# Patient Record
Sex: Male | Born: 1945 | Race: White | Hispanic: No | Marital: Married | Smoking: Former smoker
Health system: Southern US, Community
[De-identification: ages and names within clinical notes are randomized; demographics above are authoritative.]

## PROBLEM LIST (undated history)

## (undated) DIAGNOSIS — C61 Malignant neoplasm of prostate: Secondary | ICD-10-CM

## (undated) DIAGNOSIS — F4310 Post-traumatic stress disorder, unspecified: Secondary | ICD-10-CM

## (undated) DIAGNOSIS — R011 Cardiac murmur, unspecified: Secondary | ICD-10-CM

## (undated) DIAGNOSIS — E785 Hyperlipidemia, unspecified: Secondary | ICD-10-CM

## (undated) HISTORY — DX: Malignant neoplasm of prostate: C61

## (undated) HISTORY — DX: Cardiac murmur, unspecified: R01.1

## (undated) HISTORY — PX: PROSTATECTOMY: SHX69

## (undated) HISTORY — PX: TONSILLECTOMY: SUR1361

## (undated) HISTORY — DX: Hyperlipidemia, unspecified: E78.5

## (undated) HISTORY — PX: KNEE ARTHROSCOPY: SHX127

## (undated) HISTORY — DX: Post-traumatic stress disorder, unspecified: F43.10

## (undated) HISTORY — PX: HAND SURGERY: SHX662

## (undated) HISTORY — PX: INGUINAL HERNIA REPAIR: SUR1180

## (undated) HISTORY — PX: NASAL SEPTUM SURGERY: SHX37

---

## 2004-08-14 ENCOUNTER — Ambulatory Visit: Payer: Self-pay | Admitting: Internal Medicine

## 2004-09-02 ENCOUNTER — Ambulatory Visit: Payer: Self-pay | Admitting: Internal Medicine

## 2005-02-23 ENCOUNTER — Ambulatory Visit: Payer: Self-pay

## 2005-05-09 ENCOUNTER — Encounter: Admission: RE | Admit: 2005-05-09 | Discharge: 2005-05-09 | Payer: Self-pay | Admitting: Family Medicine

## 2006-10-04 DIAGNOSIS — C61 Malignant neoplasm of prostate: Secondary | ICD-10-CM

## 2006-10-04 HISTORY — DX: Malignant neoplasm of prostate: C61

## 2007-04-18 ENCOUNTER — Ambulatory Visit (HOSPITAL_COMMUNITY): Admission: RE | Admit: 2007-04-18 | Discharge: 2007-04-18 | Payer: Self-pay | Admitting: Urology

## 2007-05-02 ENCOUNTER — Ambulatory Visit: Admission: RE | Admit: 2007-05-02 | Discharge: 2007-06-23 | Payer: Self-pay | Admitting: Radiation Oncology

## 2007-06-15 ENCOUNTER — Ambulatory Visit: Admission: RE | Admit: 2007-06-15 | Discharge: 2007-06-15 | Payer: Self-pay | Admitting: Urology

## 2007-06-27 ENCOUNTER — Encounter (INDEPENDENT_AMBULATORY_CARE_PROVIDER_SITE_OTHER): Payer: Self-pay | Admitting: Urology

## 2007-06-27 ENCOUNTER — Inpatient Hospital Stay (HOSPITAL_COMMUNITY): Admission: RE | Admit: 2007-06-27 | Discharge: 2007-06-28 | Payer: Self-pay | Admitting: Urology

## 2009-07-09 ENCOUNTER — Encounter (INDEPENDENT_AMBULATORY_CARE_PROVIDER_SITE_OTHER): Payer: Self-pay | Admitting: *Deleted

## 2009-08-05 ENCOUNTER — Ambulatory Visit: Payer: Self-pay | Admitting: Internal Medicine

## 2009-08-19 ENCOUNTER — Ambulatory Visit: Payer: Self-pay | Admitting: Internal Medicine

## 2010-02-14 ENCOUNTER — Emergency Department (HOSPITAL_BASED_OUTPATIENT_CLINIC_OR_DEPARTMENT_OTHER): Admission: EM | Admit: 2010-02-14 | Discharge: 2010-02-14 | Payer: Self-pay | Admitting: Emergency Medicine

## 2011-02-16 NOTE — H&P (Signed)
[name], [name]              ACCOUNT NO.:  [id number]   MEDICAL RECORD NO.:  [id number]          PATIENT TYPE:  INP   LOCATION:  1440                         FACILITY:  [facility]   PHYSICIAN:  Ronald L. Earlene Plater, M.D.  DATE OF BIRTH:  November 12, 1945   DATE OF ADMISSION:  06/27/2007  DATE OF DISCHARGE:                              HISTORY & PHYSICAL   CHIEF COMPLAINT:  Here for surgery.   HISTORY OF PRESENT ILLNESS:  [name] is a 65 year old male who was  found to have an elevated PSA by his primary care physician.  In  addition, he had a left-sided nodule.  He was biopsied and found to have  Gleason's 3+4 primarily on the left side, although he did have a small  focus of Gleason 3+4 and Gleason 3+3 on the right.  His metastatic  survey was negative.  He presents for surgical extirpation.  Again, he  is intermediate risk Gleason's 3+4, clinical stage T2B, pretreatment TSA  3.4.   PAST MEDICAL HISTORY:  Positive for:  1. A heart murmur.  2. Hypercholesterolemia.  3. Gastroesophageal reflux disease.   PAST SURGICAL HISTORY:  1. Tonsillectomy.  2. Remote hand and knee surgery.   MEDICATIONS:  1. Vytorin.  2. TriCor.  3. Multivitamin.  4. Aspirin.   ALLERGIES:  PENICILLIN.   FAMILY HISTORY:  Negative for GU malignancy or nephrolithiasis.   SOCIAL HISTORY:  The patient has approximately a 6-pack year history and  has been abstinent for 35 years.  He has a glass of beer daily.   REVIEW OF SYSTEMS:  Negative for urinary symptoms.  CONSTITUTIONAL:  Negative for fever, night sweats.  SKIN:  Negative for itching or  rashes.  EYES:  Negative for blurred or double vision.  ENT:  Negative  for sore throat or sinus problems.  HEMATOLOGIC:  Negative for easy  bruising or bleeding.  CARDIOVASCULAR:  Negative for chest pain or leg  swelling.  RESPIRATORY:  Negative for cough, shortness of breath.  ENDOCRINE:  Negative for diabetes mellitus or thyroid gland dysfunction.  MUSCULOSKELETAL:   No back or joint pain.  GASTROINTESTINAL:  No nausea,  vomiting or abdominal pain.  No melena or hematochezia.  NEUROLOGIC:  Negative for headache or dizziness.  PSYCHOLOGIC:  No depression or  anxiety.   PHYSICAL EXAM:  VITAL SIGNS:  Afebrile, stable vitals.  HEENT:  Head is normocephalic and atraumatic and extraocular movements  are intact.  NECK:  Supple with no lymphadenopathy.  CHEST:  Clear to auscultation.  HEART:  Regular in rate and rhythm.  ABDOMEN:  Soft, nontender, nondistended.  TESTES:  Descended bilaterally, normal in contour.  SKIN:  Without rashes or lesions.  EXTREMITIES:  Warm and well perfused.   IMPRESSION:  Intermediate risk Gleason's 3 plus 4, clinical stage T2B,  pretreatment PSA 3.4, adenocarcinoma of the prostate.   PLAN:  The patient will be taken to the operating room for robotic-  assisted laparoscopic prostatectomy with a bilateral pelvic  lymphadenectomy.  He has been informed of the risks and benefits of the  procedure and signed the consent form on  the chart.  He will be admitted  after surgery for a period of observation.     ______________________________  Celene Squibb. Earlene Plater, M.D.  Electronically Signed    JR/MEDQ  D:  06/27/2007  T:  06/27/2007  Job:  [id number]

## 2011-02-16 NOTE — Op Note (Signed)
[name], GUTHRIE              ACCOUNT NO.:  [id number]   MEDICAL RECORD NO.:  [id number]          PATIENT TYPE:  INP   LOCATION:  1440                         [facility]ACILITY:  [facility]   PHYSICIAN:  Ronald L. Earlene Plater, M.D.  DATE OF BIRTH:  04/27/1946   DATE OF PROCEDURE:  06/27/2007  DATE OF DISCHARGE:                               OPERATIVE REPORT   PREOPERATIVE DIAGNOSIS:  Clinically localized adenocarcinoma of the  prostate.   POSTOPERATIVE DIAGNOSIS:  Clinically localized adenocarcinoma of the  prostate.   PROCEDURE PERFORMED:  1. Robotic assisted laparoscopic prostatectomy.  2. Bilateral pelvic lymphadenectomy.   SURGEON:  Dr. Gaynelle Arabian.   RESIDENT:  Melina Schools.   ANESTHESIA:  General endotracheal.   DRAINS:  Coude catheter, Jackson-Pratt.   ESTIMATED BLOOD LOSS:  200 mL.   INDICATION FOR PROCEDURE:  Please see full dictated H&P.  Briefly, Mr.  [name] is a 65 year old male with clinically localized adenocarcinoma of  the prostate.  He has clinical T2B disease on DRE, and Gleason's 3 +4,  primarily on the left on prostate biopsy.  His pretreatment PSA is 3.4.   DESCRIPTION OF PROCEDURE IN DETAIL:  The patient was brought to the  operating room.  He is identified by his arm band.  Informed consent is  verified, and preoperative time-out is performed.  After the successful  induction of general endotracheal anesthesia, the patient was moved to  the low lithotomy position where the abdomen and genitalia were prepped  and draped in the usual fashion.  The surgeons regowned and regloved.  Foley catheter was inserted transurethrally into the bladder.  Bladder  was drained, and the Foley was plugged.  A supraumbilical incision was  made in the midline.  Blunt and sharp dissection was used to carry this  down to the abdominal wall fascia which was incised sharply under direct  vision.  The peritoneum was similarly incised under direct vision, and  the abdominal cavity was  entered.  We then placed a 12 mm camera port  under direct vision by cut-down.  We then insufflated the abdomen with  carbon dioxide.  Once the pneumoperitoneum was fully established, we  measured our remaining ports.  Lidocaine was injected subcutaneously.  We placed two flanking 8 mm robotic ports.  We placed a third robotic  port in the left lower quadrant.  A 12 mm assistant port was placed in  the right lower quadrant in the mid axillary line.  The suction port was  placed in the paramedian position.  We then performed robotic assisted  laparoscopy.  The median umbilical ligaments were identified and  divided.  We then dissected down toward the pelvic brim.  We noted very  narrow pelvis.  Once the bladder was dropped, we cleared the fat off the  anterior portion of the prostate.  The fourth arm was used to place the  bladder on traction.  We then identified the endopelvic fascia lateral  to the prostatic plane.  This was entered sharply.  The plane was  developed between the prostate and the levator ani.  Then these were  swept gently away with both blunt dissection and bipolar electrocautery.  Once the lateral plane was dissolved bilaterally, we noted the groove  for the dorsal vein.  A vascular GIA staple load was used to ligate  that.  Having incised the endopelvic fascia bilaterally and completed  the lateral dissection as well as the ligation of the vein, we proceeded  to bladder neck.  The Foley catheter was placed on gentle traction to  delineate the bladder neck.  This was then incised with Bovie cautery.  Once we entered the bladder, the Foley catheter was seen and placed on  gentle upward traction, placing the prostate in the hammock position.  We then completed the posterior portion of the bladder neck, and the  bladder was completely dropped off the specimen.  We then began the  dissection for the structures.  We remained in the midline and  identified bilateral vaso  differentia.  These were placed on upward  traction and gently dissected out.  Once an adequate length was  developed on the left vas, it was divided sharply and placed on upward  traction.  A similar dissection was done on the right vas.  We then  again dissected out the left and right seminal vesicles with a  combination of blunt and sharp dissection.  There appeared to be  somewhat fibrous rind surrounding the SVs bilaterally.  Once the  structures were completely divided, we proceeded with the posterior  dissection.  We attempted to find the plane 1 layer below Denonvilliers  fascia.  This entire area was very indurated, and all the planes had  been obliterated.  The decision was then made to improve exposure by  sacrificing the pedicles bilaterally.  This was done with a series of  Hem-o-lok clips.  We continued to have extremely poor visualization  posteriorly and were unable to develop the plane on the posterior aspect  of the prostate.  We then decided to forgo this at this time and proceed  with the urethral division.  The urethra was divided sharply with the  guide of a Foley catheter.  We then continued to circumferentially  develop the posterior plane until the entire specimen was removed.  The  prostate and seminal vesicles were then placed in the right upper  quadrant.  We then proceeded to pelvic lymphadenectomy.  This was done  bilaterally.  The limits of the dissection were the iliacus muscle  medially, the high epigastric, the obturator nerve posteriorly, the  iliac vessels laterally, and it was taken from the superior aspect of  the bifurcation down to Cloquet's node.  This was done bilaterally, and  these were sent separately for permanent pathology.  We then proceeded  with the vesicourethral anastomosis.  We first irrigated the pelvis and  insufflated the rectum with air and saw no leak.  A single Vicryl suture  was used to reapproximate the posterior aspect of the  urethra to the  posterior bladder neck.  The anastomosis was then closed with a running  double-armed Monocryl in usual fashion.  It was tested afterwards and  was noted to be watertight.  We placed J-P drain up the left most port.  We then undocked the robot.  We then placed the specimen in an EndoCatch  device.  We then extended the median camera portals at extraction point.  The prostate was removed en bloc.  Fascia was closed with 0 Vicryl.  The  skin was closed with staples.  The  drain was sutured in place with a  nylon.  At this time, the procedure was terminated.  The patient  tolerated the procedure well, and there were no complications.  Dr. Darvin Neighbours was the attending primary responsible physician, present and  participated in all aspects of the procedure.   DISPOSITION:  The patient was extubated uneventfully and transferred  safely to the postanesthesia care unit.      Terie Purser, MD      Lucrezia Starch. Earlene Plater, M.D.  Electronically Signed    JH/MEDQ  D:  06/27/2007  T:  06/27/2007  Job:  [id number]

## 2011-07-15 LAB — HEMOGLOBIN AND HEMATOCRIT, BLOOD: Hemoglobin: 12.6 — ABNORMAL LOW

## 2011-07-16 LAB — COMPREHENSIVE METABOLIC PANEL
ALT: 29
Alkaline Phosphatase: 39
BUN: 11
CO2: 26
Calcium: 9.3
GFR calc Af Amer: >60
GFR calc non Af Amer: >60
Glucose, Bld: 101 — ABNORMAL HIGH
Sodium: 139
Total Bilirubin: 1

## 2011-07-16 LAB — CBC
HCT: 39.9
Hemoglobin: 13.9
Platelets: 285
RBC: 4.44

## 2011-07-16 LAB — PROTIME-INR
INR: 0.9
Prothrombin Time: 12.8

## 2011-07-16 LAB — URINALYSIS, ROUTINE W REFLEX MICROSCOPIC
Ketones, ur: NEGATIVE
Nitrite: NEGATIVE
Protein, ur: NEGATIVE
pH: 6.5

## 2011-07-16 LAB — APTT: aPTT: 30

## 2015-01-30 ENCOUNTER — Encounter: Payer: Self-pay | Admitting: Internal Medicine

## 2015-01-30 ENCOUNTER — Encounter: Payer: Self-pay | Admitting: Gastroenterology

## 2015-10-05 HISTORY — PX: COLONOSCOPY: SHX174

## 2016-06-14 ENCOUNTER — Encounter: Payer: Self-pay | Admitting: Gastroenterology

## 2016-06-14 ENCOUNTER — Telehealth: Payer: Self-pay | Admitting: Internal Medicine

## 2016-06-14 NOTE — Telephone Encounter (Signed)
Nov 2017 was intended recall based upon hx of polyp it looks like  That looks ok to me  He could schedule it now if desired

## 2016-06-14 NOTE — Telephone Encounter (Signed)
Should this go to Doc of the day, I don't see where the pt has been seen by anyone since Dr Olevia Perches?

## 2016-06-14 NOTE — Telephone Encounter (Signed)
Sheri,  Dr. Carlean Purl is Doc of the Day. Please advise as to when next colon should be. Thanks.

## 2016-06-14 NOTE — Telephone Encounter (Signed)
Colonoscopy scheduled with Dr. Danis °

## 2016-06-14 NOTE — Telephone Encounter (Signed)
Please review and advise recall date.  Hx with [name]

## 2016-08-19 ENCOUNTER — Ambulatory Visit (AMBULATORY_SURGERY_CENTER): Payer: Self-pay

## 2016-08-19 VITALS — Ht 69.0 in | Wt 197.6 lb

## 2016-08-19 DIAGNOSIS — Z800 Family history of malignant neoplasm of digestive organs: Secondary | ICD-10-CM

## 2016-08-19 MED ORDER — SUPREP BOWEL PREP KIT 17.5-3.13-1.6 GM/177ML PO SOLN
1.0000 | Freq: Once | ORAL | 0 refills | Status: AC
Start: 2016-08-19 — End: 2016-08-19

## 2016-08-19 NOTE — Progress Notes (Signed)
No allergies to eggs or soy No diet meds No home oxygen No past problems with anesthesia  Declined emmi 

## 2016-09-02 ENCOUNTER — Encounter: Payer: Self-pay | Admitting: Gastroenterology

## 2016-09-02 ENCOUNTER — Ambulatory Visit (AMBULATORY_SURGERY_CENTER): Payer: Medicare Other | Admitting: Gastroenterology

## 2016-09-02 VITALS — BP 125/73 | HR 53 | Temp 97.3°F | Resp 15 | Ht 69.0 in | Wt 197.0 lb

## 2016-09-02 DIAGNOSIS — Z86010 Personal history of colonic polyps: Secondary | ICD-10-CM | POA: Diagnosis present

## 2016-09-02 DIAGNOSIS — D123 Benign neoplasm of transverse colon: Secondary | ICD-10-CM | POA: Diagnosis not present

## 2016-09-02 MED ORDER — SODIUM CHLORIDE 0.9 % IV SOLN: 500.0000 mL | INTRAVENOUS | Status: DC

## 2016-09-02 NOTE — Op Note (Signed)
[name]: [name] Procedure Date: 09/02/2016 10:02 AM MRN: [id number] Endoscopist: Malo. Loletha Carrow , MD Age: 70 Referring MD:  Date of Birth: Feb 11, 1946 Gender: Male Account #: [id number] Procedure:                Colonoscopy Indications:              High risk colon cancer surveillance: Personal                            history of colonic polyps ( polyp not retrieved in                            2005, no polyps on colonoscopy 2010) Medicines:                Monitored Anesthesia Care Procedure:                Pre-Anesthesia Assessment:                           - Prior to the procedure, a History and Physical                            was performed, and patient medications and                            allergies were reviewed. The patient's tolerance of                            previous anesthesia was also reviewed. The risks                            and benefits of the procedure and the sedation                            options and risks were discussed with the patient.                            All questions were answered, and informed consent                            was obtained. Anticoagulants: The patient has taken                            aspirin. It was decided not to withhold this                            medication prior to the procedure. ASA Grade                            Assessment: II - A patient with mild systemic                            disease. After reviewing the risks and benefits,  the patient was deemed in satisfactory condition to                            undergo the procedure.                           After obtaining informed consent, the colonoscope                            was passed under direct vision. Throughout the                            procedure, the patient's blood pressure, pulse, and                            oxygen saturations were monitored continuously. The                             Model CF-HQ190L [id number]) scope was introduced                            through the anus and advanced to the the cecum,                            identified by appendiceal orifice and ileocecal                            valve. The colonoscopy was performed without                            difficulty. The patient tolerated the procedure                            well. The quality of the bowel preparation was                            excellent. The ileocecal valve, appendiceal                            orifice, and rectum were photographed. The bowel                            preparation used was SUPREP. Scope In: 10:15:11 AM Scope Out: 10:31:09 AM Scope Withdrawal Time: 0 hours 11 minutes 34 seconds  Total Procedure Duration: 0 hours 15 minutes 58 seconds  Findings:                 The perianal and digital rectal examinations were                            normal.                           A 4 mm polyp was found in the proximal transverse  colon. The polyp was sessile. The polyp was removed                            with a cold snare. Resection and retrieval were                            complete.                           The exam was otherwise without abnormality on                            direct and retroflexion views. Complications:            No immediate complications. Estimated Blood Loss:     Estimated blood loss: none. Impression:               - One 4 mm polyp in the proximal transverse colon,                            removed with a cold snare. Resected and retrieved.                           - The examination was otherwise normal on direct                            and retroflexion views. Recommendation:           - Patient has a contact number available for                            emergencies. The signs and symptoms of potential                            delayed complications were discussed with  the                            patient. Return to normal activities tomorrow.                            Written discharge instructions were provided to the                            patient.                           - Resume previous diet.                           - Continue present medications.                           - Await pathology results.                           - Repeat colonoscopy is recommended for  surveillance. The colonoscopy date will be                            determined after pathology results from today's                            exam become available for review. Henry L. Loletha Carrow, MD 09/02/2016 10:35:04 AM This report has been signed electronically.

## 2016-09-02 NOTE — Progress Notes (Signed)
Called to room to assist during endoscopic procedure.  Patient ID and intended procedure confirmed with present staff. Received instructions for my participation in the procedure from the performing physician.  

## 2016-09-02 NOTE — Patient Instructions (Signed)
YOU HAD AN ENDOSCOPIC PROCEDURE TODAY AT THE [facility]:   Refer to the procedure report that was given to you for any specific questions about what was found during the examination.  If the procedure report does not answer your questions, please call your gastroenterologist to clarify.  If you requested that your care partner not be given the details of your procedure findings, then the procedure report has been included in a sealed envelope for you to review at your convenience later.  YOU SHOULD EXPECT: Some feelings of bloating in the abdomen. Passage of more gas than usual.  Walking can help get rid of the air that was put into your GI tract during the procedure and reduce the bloating. If you had a lower endoscopy (such as a colonoscopy or flexible sigmoidoscopy) you may notice spotting of blood in your stool or on the toilet paper. If you underwent a bowel prep for your procedure, you may not have a normal bowel movement for a few days.  Please Note:  You might notice some irritation and congestion in your nose or some drainage.  This is from the oxygen used during your procedure.  There is no need for concern and it should clear up in a day or so.  SYMPTOMS TO REPORT IMMEDIATELY:   Following lower endoscopy (colonoscopy or flexible sigmoidoscopy):  Excessive amounts of blood in the stool  Significant tenderness or worsening of abdominal pains  Swelling of the abdomen that is new, acute  Fever of 100F or higher   For urgent or emergent issues, a gastroenterologist can be reached at any hour by calling [phone].   DIET:  We do recommend a small meal at first, but then you may proceed to your regular diet.  Drink plenty of fluids but you should avoid alcoholic beverages for 24 hours.  ACTIVITY:  You should plan to take it easy for the rest of today and you should NOT DRIVE or use heavy machinery until tomorrow (because of the sedation medicines used during the test).     FOLLOW UP: Our staff will call the number listed on your records the next business day following your procedure to check on you and address any questions or concerns that you may have regarding the information given to you following your procedure. If we do not reach you, we will leave a message.  However, if you are feeling well and you are not experiencing any problems, there is no need to return our call.  We will assume that you have returned to your regular daily activities without incident.  If any biopsies were taken you will be contacted by phone or by letter within the next 1-3 weeks.  Please call us at [phone] if you have not heard about the biopsies in 3 weeks.    SIGNATURES/CONFIDENTIALITY: You and/or your care partner have signed paperwork which will be entered into your electronic medical record.  These signatures attest to the fact that that the information above on your After Visit Summary has been reviewed and is understood.  Full responsibility of the confidentiality of this discharge information lies with you and/or your care-partner.  Read all handouts given to you by your recovery room nurse.   Thank-you for choosing us for your healthcare needs today. 

## 2016-09-02 NOTE — Progress Notes (Signed)
Report to PACU, RN, vss, BBS= Clear.  

## 2016-09-03 ENCOUNTER — Telehealth: Payer: Self-pay

## 2016-09-03 NOTE — Telephone Encounter (Signed)
  Follow up Call-  Call back number 09/02/2016  Post procedure Call Back phone  # [phone]  Permission to leave phone message Yes  Some recent data might be hidden     Patient questions:  Do you have a fever, pain , or abdominal swelling? No. Pain Score  0 *  Have you tolerated food without any problems? Yes.    Have you been able to return to your normal activities? Yes.    Do you have any questions about your discharge instructions: Diet   No. Medications  No. Follow up visit  No.  Do you have questions or concerns about your Care? No.  Actions: * If pain score is 4 or above: No action needed, pain <4.

## 2016-09-10 ENCOUNTER — Encounter: Payer: Self-pay | Admitting: Gastroenterology

## 2019-10-30 ENCOUNTER — Ambulatory Visit: Payer: Medicare Other

## 2019-11-08 ENCOUNTER — Ambulatory Visit: Payer: Medicare Other | Attending: Internal Medicine

## 2019-11-08 DIAGNOSIS — Z23 Encounter for immunization: Secondary | ICD-10-CM

## 2019-11-08 NOTE — Progress Notes (Signed)
   Covid-19 Vaccination Clinic  Name:  [name]    MRN: [id number] DOB: [birth year]  11/08/2019  [name] was observed post Covid-19 immunization for 15 minutes without incidence. He was provided with Vaccine Information Sheet and instruction to access the V-Safe system.   [name] was instructed to call 911 with any severe reactions post vaccine: Marland Kitchen Difficulty breathing  . Swelling of your face and throat  . A fast heartbeat  . A bad rash all over your body  . Dizziness and weakness    Immunizations Administered    Name Date Dose VIS Date Route   Pfizer COVID-19 Vaccine 11/08/2019  3:50 PM 0.3 mL 09/14/2019 Intramuscular   Manufacturer: [place]   Lot: [id number]   [place]: [id number]

## 2019-11-10 ENCOUNTER — Ambulatory Visit: Payer: Medicare Other

## 2019-11-14 ENCOUNTER — Other Ambulatory Visit: Payer: Self-pay | Admitting: Neurosurgery

## 2019-11-14 ENCOUNTER — Other Ambulatory Visit (HOSPITAL_COMMUNITY): Payer: Self-pay | Admitting: Neurosurgery

## 2019-11-14 DIAGNOSIS — G959 Disease of spinal cord, unspecified: Secondary | ICD-10-CM

## 2019-11-14 DIAGNOSIS — M5440 Lumbago with sciatica, unspecified side: Secondary | ICD-10-CM

## 2019-11-27 ENCOUNTER — Other Ambulatory Visit: Payer: Self-pay

## 2019-11-27 ENCOUNTER — Ambulatory Visit (HOSPITAL_COMMUNITY)
Admission: RE | Admit: 2019-11-27 | Discharge: 2019-11-27 | Disposition: A | Discharge status: Home / Self Care | Payer: Medicare Other | Source: Ambulatory Visit | Attending: Neurosurgery | Admitting: Neurosurgery

## 2019-11-27 DIAGNOSIS — G959 Disease of spinal cord, unspecified: Secondary | ICD-10-CM | POA: Diagnosis present

## 2019-11-27 DIAGNOSIS — M5440 Lumbago with sciatica, unspecified side: Secondary | ICD-10-CM | POA: Diagnosis present

## 2019-11-30 ENCOUNTER — Ambulatory Visit: Payer: Medicare Other

## 2019-12-03 ENCOUNTER — Ambulatory Visit: Payer: Medicare Other | Attending: Internal Medicine

## 2019-12-03 DIAGNOSIS — Z23 Encounter for immunization: Secondary | ICD-10-CM

## 2019-12-03 NOTE — Progress Notes (Signed)
   Covid-19 Vaccination Clinic  Name:  [name]    MRN: [id number] DOB: [birth year]  12/03/2019  [name] was observed post Covid-19 immunization for 15 minutes without incidence. He was provided with Vaccine Information Sheet and instruction to access the V-Safe system.   [name] was instructed to call 911 with any severe reactions post vaccine: Marland Kitchen Difficulty breathing  . Swelling of your face and throat  . A fast heartbeat  . A bad rash all over your body  . Dizziness and weakness    Immunizations Administered    Name Date Dose VIS Date Route   Pfizer COVID-19 Vaccine 12/03/2019  5:32 PM 0.3 mL 09/14/2019 Intramuscular   Manufacturer: [place]   Lot: [id number]   [place]: [id number]

## 2020-01-08 ENCOUNTER — Encounter: Payer: Self-pay | Admitting: Physical Therapy

## 2020-01-08 ENCOUNTER — Ambulatory Visit: Payer: No Typology Code available for payment source | Attending: Neurosurgery | Admitting: Physical Therapy

## 2020-01-08 ENCOUNTER — Other Ambulatory Visit: Payer: Self-pay

## 2020-01-08 DIAGNOSIS — M6281 Muscle weakness (generalized): Secondary | ICD-10-CM | POA: Diagnosis present

## 2020-01-08 DIAGNOSIS — M545 Low back pain, unspecified: Secondary | ICD-10-CM

## 2020-01-08 NOTE — Patient Instructions (Signed)
Access Code: [facility] URL: [email or web address] Date: 01/08/2020 Prepared by: [name]  Exercises Hooklying Single Knee to Chest Stretch - 1 x daily - 7 x weekly - 1 sets - 3 reps - 30 hold Supine Sciatic Nerve Glide - 1 x daily - 7 x weekly - 1 sets - 10 reps Supine Transversus Abdominis Bracing - Hands on Stomach - 1 x daily - 7 x weekly - 10 reps - 3 sets Hooklying Isometric Hip Flexion - 1 x daily - 7 x weekly - 1 sets - 5 reps - 5 hold Standing Hip Hinge with Dowel - 1 x daily - 7 x weekly - 1 sets - 10 reps

## 2020-01-08 NOTE — Therapy (Signed)
[facility] [address], [place], [place], [address] Phone: [phone]   Fax:  [phone]  Physical Therapy Evaluation  Patient Details  Name: [name] MRN: [id number] Date of Birth: 11/30/1945 Referring Provider (PT): Dr. Saintclair Halsted   Encounter Date: 01/08/2020  PT End of Session - 01/08/20 1916    Visit Number  1    Number of Visits  15    Date for PT Re-Evaluation  03/04/20    Authorization Type  VA approved Eval + 14 visits    ends 06/03/20    PT Start Time  1015    PT Stop Time  1100    PT Time Calculation (min)  45 min    Activity Tolerance  Patient tolerated treatment well       Past Medical History:  Diagnosis Date  . Cardiac murmur   . Hyperlipidemia   . Prostate cancer [facility])     Past Surgical History:  Procedure Laterality Date  . HAND SURGERY     4-5 times  . INGUINAL HERNIA REPAIR    . KNEE ARTHROSCOPY     left  . NASAL SEPTUM SURGERY    . PROSTATECTOMY    . TONSILLECTOMY      There were no vitals filed for this visit.   Subjective Assessment - 01/08/20 1021    Subjective  December 6th started having back trouble after stepping in a hole or walking on treadmill. I could barely walk.  Had left thigh pain for 2-3 weeks.  Got steroids and muscle relaxer helped temporarily.  Had surgery 4 weeks ago in March at L3-4 for ruptured disc.  Instructed to walk 1-2 miles.  Able to do mile and a half 2x/day.   I want to work in the yard.    Pertinent History  Lumbar surgery 12/10/19 (non fusion) PTSD;  No lifting > 10#; bend carefully;  no riding mower for 6 weeks;  tingling in leg knee down which MD said might last 1 year    Limitations  Lifting;House hold activities    How long can you sit comfortably?  stiff after sitting;  30 minutes    How long can you walk comfortably?  1 1/2 miles    Diagnostic tests  MRI L3-4 ruptured disc    Patient Stated Goals  back to working outside; lose 20# I've gained  since December    Currently in Pain?  No/denies    Pain Score  0-No pain    Pain Location  Back    Pain Orientation  Left    Pain Type  Surgical pain    Aggravating Factors   mornings, after sitting; reach across for seatbelt;  thigh gets tight after walking    Pain Relieving Factors  hot shower         [facility] PT Assessment - 01/08/20 0001      Assessment   Medical Diagnosis  lumbar surgery for ruptured disc    Referring Provider (PT)  Dr. Saintclair Halsted    Onset Date/Surgical Date  12/10/19    Next MD Visit  MD at the end of April     Prior Therapy  hand therapy many years ago       Precautions   Precautions  Back      Restrictions   Weight Bearing Restrictions  No      Balance Screen   Has the patient fallen in the past 6 months  No  Has the patient had a decrease in activity level because of a fear of falling?   No    Is the patient reluctant to leave their home because of a fear of falling?   No      Home Environment   Living Environment  Private residence    Living Arrangements  Spouse/significant other    Type of [place] to enter    Entrance Stairs-Number of Steps  [place]  One level      Prior Function   Level of Independence  Independent with basic ADLs    Vocation  Retired    Leisure  yard work; travel       Observation/Other Assessments   Focus on Therapeutic Outcomes (FOTO)   51% limitation       Functional Tests   Functional tests  Sit to Stand;Other      Sit to Stand   Comments  instructed in hip hinge with sit to stand to avoid excessive spinal flexion       Other:   Other/ Comments  to pick up small objects from floor, the patient demonstrates a safe method of half kneeling to retrieve       AROM   Lumbar Flexion  --   not tested secondary to recent surgery    Lumbar Extension  5    Lumbar - Right Side Bend  20    Lumbar - Left Side Bend  20      Strength   Overall Strength Comments  normal knee and ankle  strength     Right Hip Flexion  4/5    Right Hip ABduction  4/5    Left Hip Flexion  4/5    Left Hip ABduction  4/5    Lumbar Flexion  4-/5    Lumbar Extension  4-/5      Special Tests   Other special tests  + neural tension in supine                 Objective measurements completed on examination: See above findings.              PT Education - 01/08/20 1914    Education Details  Access Code: [email or web address] supine KTC;  supine on/off sciatic neural tension; supine abdominal brace;  knee to hand push in supine; hip hinge using golf club for 3 points of contact    Person(s) Educated  Patient    Methods  Explanation;Demonstration;Handout    Comprehension  Returned demonstration;Verbalized understanding       PT Short Term Goals - 01/08/20 1942      PT SHORT TERM GOAL #1   Title  The patient will demonstrate understanding of initial HEP to promote healing and safe return to function    Time  4    Period  Weeks    Status  New    Target Date  02/05/20      PT SHORT TERM GOAL #2   Title  The patient will demonstrate proper hip hinge technique with sit to stand and picking up objects from the floor/ground < 10#    Time  4    Period  Weeks    Status  New      PT SHORT TERM GOAL #3   Title  Improved lumbar ROM with flexion to 40 degrees, extension to 10 degrees and sidebending to 25 degrees needed for  return to yard work    Time  4    Period  Weeks    Status  New        PT Long Term Goals - 01/08/20 1946      PT LONG TERM GOAL #1   Title  The patient will be independent in safe self progression of HEP    Time  8    Period  Weeks    Status  New    Target Date  03/04/20      PT LONG TERM GOAL #2   Title  The patient will be able to walk 2 miles with pain level 2/10 or less    Time  8    Period  Weeks    Status  New      PT LONG TERM GOAL #3   Title  The patient will have grossly 4+/5 lumbo/pelvic/hip core strength needed for medium lifting and yard  work    Time  8    Period  Weeks    Status  New      PT LONG TERM GOAL #4   Title  The patient will be able to return to 75% of usual outdoor yard activities with pain level 3/10 or less    Time  8    Period  Weeks    Status  New      PT LONG TERM GOAL #5   Title  FOTO functional outcome score improved from 51% to 40%    Time  8    Period  Weeks    Status  New             Plan - 01/08/20 1917    Clinical Impression Statement  The patient reports the onset of left anterior thigh pain on December 6th.  He is unsure if it was the result of a misstep in a hole in the yard or overdoing it on the treadmill.  He also had some LBP and had great difficulty walking.  He had short term relief from injection and muscle relaxers.  He was diagnosed with a herniated disc of L3-4 and underwent surgery on 12/10/19 (non-fusion).  He has increased his walking distance to 1 1/2 miles 2x/day.    He is eager to return to working outside/yard work but has a 10# lifting limit and was told to not use his riding mower or push mower for 2-3 more weeks.  Low pain intensity today with primary complaint of stiffness.  He does have some mild LE intermittent tingling.  Surgical incision appears well healed.  Decreased lumbar ROM in all planes.  Instructed in hip hinge with sit to stand and as a safe way to bend forward.  Decreased lumbo/pelvic/hip strength grossly 4- to 4/5.  He is familiar with transverse abdominus muscle activation and regularly practices this since his prostrate issue.  Left > right neural tension.  He would benefit from PT to faciliate healing and return to function as needed post lumbar surgery.    Personal Factors and Comorbidities  Age;Comorbidity 1;Comorbidity 2    Comorbidities  PTSD; history of prostate cancer    Examination-Activity Limitations  Carry;Lift;Squat;Stairs;Stand;Locomotion Level    Examination-Participation Restrictions  Community Activity;Yard Work    Stability/Clinical  Decision Making  Stable/Uncomplicated    Clinical Decision Making  Low    Rehab Potential  Good    PT Frequency  2x / week    PT Duration  8 weeks    PT  Treatment/Interventions  ADLs/Self Care Home Management;Electrical Stimulation;Cryotherapy;Moist Heat;Neuromuscular re-education;Therapeutic exercise;Therapeutic activities;Patient/family education;Manual techniques;Dry needling;Taping    PT Next Visit Plan  post surgical (12/10/19) progression of abdominal brace progression;  lumbo/pelvic/hip strengthening;  review hip hinge; neural mobility; body mechanics training to prepare for return to yard work;  wall push ups with progression to counter push ups    PT Home Exercise Plan  Access Code: [facility]    Consulted and Agree with Plan of Care  Patient       Patient will benefit from skilled therapeutic intervention in order to improve the following deficits and impairments:  Decreased range of motion, Pain, Decreased activity tolerance, Impaired perceived functional ability, Decreased strength, Impaired flexibility  Visit Diagnosis: Acute left-sided low back pain, unspecified whether sciatica present - Plan: PT plan of care cert/re-cert  Muscle weakness (generalized) - Plan: PT plan of care cert/re-cert     Problem List There are no problems to display for this patient.  [name], PT 01/08/20 7:54 PM Phone: [phone] Fax: [phone] [name] 01/08/2020, 7:53 PM  [facility] [address] [place], [place], [address] Phone: [phone]   Fax:  [phone]  Name: [name] MRN: [id number] Date of Birth: [birth year]

## 2020-01-11 ENCOUNTER — Other Ambulatory Visit: Payer: Self-pay

## 2020-01-11 ENCOUNTER — Encounter: Payer: Self-pay | Admitting: Physical Therapy

## 2020-01-11 ENCOUNTER — Ambulatory Visit: Payer: No Typology Code available for payment source | Admitting: Physical Therapy

## 2020-01-11 DIAGNOSIS — M545 Low back pain, unspecified: Secondary | ICD-10-CM

## 2020-01-11 DIAGNOSIS — M6281 Muscle weakness (generalized): Secondary | ICD-10-CM

## 2020-01-11 NOTE — Therapy (Signed)
[facility] [address] [place], [place], [address] Phone: [phone]   Fax:  [phone]  Physical Therapy Treatment  Patient Details  Name: [name] MRN: [id number] Date of Birth: [birth year] Referring Provider (PT): Dr. Saintclair Halsted   Encounter Date: 01/11/2020  PT End of Session - 01/11/20 0804    Visit Number  2    Number of Visits  15    Date for PT Re-Evaluation  03/04/20    Authorization Type  VA approved Eval + 14 visits    ends 06/03/20    PT Start Time  0803    PT Stop Time  0841    PT Time Calculation (min)  38 min    Activity Tolerance  Patient tolerated treatment well    Behavior During Therapy  [facility] for tasks assessed/performed       Past Medical History:  Diagnosis Date  . Cardiac murmur   . Hyperlipidemia   . Prostate cancer [facility])     Past Surgical History:  Procedure Laterality Date  . HAND SURGERY     4-5 times  . INGUINAL HERNIA REPAIR    . KNEE ARTHROSCOPY     left  . NASAL SEPTUM SURGERY    . PROSTATECTOMY    . TONSILLECTOMY      There were no vitals filed for this visit.  Subjective Assessment - 01/11/20 0807    Subjective  I feel good this AM.    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  No/denies    Multiple Pain Sites  No                       [facility] Adult PT Treatment/Exercise - 01/11/20 0001      Self-Care   Self-Care  ADL's    ADL's  Lumbar protective body mechanics for yardwork; PTA verbally educated, pt berbally understood principles.      Lumbar Exercises: Stretches   Single Knee to Chest Stretch  Right;Left;2 reps;20 seconds    Single Knee to Chest Stretch Limitations  Good retrun demo    Other Lumbar Stretch Exercise  Sciatic neural stretch  Bil 10x, VC to move smoothly in & out of knee bending .      Lumbar Exercises: Aerobic   Nustep  L1 x 5 min PTA present to discuss status and warm the body up      Lumbar Exercises: Supine   Isometric Hip Flexion  5 reps;5 seconds    Isometric Hip Flexion Limitations  Good return demo      Lumbar Exercises: Sidelying   Hip Abduction  Right;Left;10 reps    Hip Abduction Limitations  VC for core activation             PT Education - 01/11/20 0843    Education Details  HEP & lumbar protective body mechanics for yardwork    Person(s) Educated  Patient    Methods  Explanation    Comprehension  Verbalized understanding       PT Short Term Goals - 01/08/20 1942      PT SHORT TERM GOAL #1   Title  The patient will demonstrate understanding of initial HEP to promote  healing and safe return to function    Time  4    Period  Weeks    Status  New    Target Date  02/05/20      PT SHORT TERM GOAL #2   Title  The patient will demonstrate proper hip hinge technique with sit to stand and picking up objects from the floor/ground < 10#    Time  4    Period  Weeks    Status  New      PT SHORT TERM GOAL #3   Title  Improved lumbar ROM with flexion to 40 degrees, extension to 10 degrees and sidebending to 25 degrees needed for return to yard work    Time  4    Period  Weeks    Status  New        PT Long Term Goals - 01/08/20 1946      PT LONG TERM GOAL #1   Title  The patient will be independent in safe self progression of HEP    Time  8    Period  Weeks    Status  New    Target Date  03/04/20      PT LONG TERM GOAL #2   Title  The patient will be able to walk 2 miles with pain level 2/10 or less    Time  8    Period  Weeks    Status  New      PT LONG TERM GOAL #3   Title  The patient will have grossly 4+/5 lumbo/pelvic/hip core strength needed for medium lifting and yard work    Time  8    Period  Weeks    Status  New      PT LONG TERM GOAL #4   Title  The  patient will be able to return to 75% of usual outdoor yard activities with pain level 3/10 or less    Time  8    Period  Weeks    Status  New      PT LONG TERM GOAL #5   Title  FOTO functional outcome score improved from 51% to 40%    Time  8    Period  Weeks    Status  New            Plan - 01/11/20 [id number]    Clinical Impression Statement  Pt arrives pain free/symptom free this AM. Pt is independent in initial HEP. HEp was added to today to incorporate additional core challengee and hip abduction strength. Pt was educated in lumbar protective for yardwork that he will incorporate when given the green light. No pain or leg symptoms during session.    Personal Factors and Comorbidities  Age;Comorbidity 1;Comorbidity 2    Comorbidities  PTSD; history of prostate cancer    Examination-Activity Limitations  Carry;Lift;Squat;Stairs;Stand;Locomotion Level    Examination-Participation Restrictions  Community Activity;Yard Work    Stability/Clinical Decision Making  Stable/Uncomplicated    Rehab Potential  Good    PT Frequency  2x / week    PT Duration  8 weeks    PT Treatment/Interventions  ADLs/Self Care Home Management;Electrical Stimulation;Cryotherapy;Moist Heat;Neuromuscular re-education;Therapeutic exercise;Therapeutic activities;Patient/family education;Manual techniques;Dry needling;Taping    PT Next Visit Plan  Progression of core stabilization exercises, begin wall pushups with eventual progression to countertop, seated multifidus ex?    PT Home Exercise Plan  Access Code: [facility]    Consulted and Agree with Plan of Care  Patient  Patient will benefit from skilled therapeutic intervention in order to improve the following deficits and impairments:  Decreased range of motion, Pain, Decreased activity tolerance, Impaired perceived functional ability, Decreased strength, Impaired flexibility  Visit Diagnosis: Acute left-sided low back pain, unspecified whether sciatica  present  Muscle weakness (generalized)     Problem List There are no problems to display for this patient.   COCHRAN,JENNIFER, PTA 01/11/2020, 8:44 AM  [facility] [address], [place], [place], [address] Phone: [phone]   Fax:  [phone]  Name: [name] MRN: [id number] Date of Birth: [birth year]  Access Code: [facility]: [email or web address]: 04/09/2021Prepared by: Anderson Malta CochranExercises  Hooklying Single Knee to Chest Stretch - 1 x daily - 7 x weekly - 1 sets - 3 reps - 30 hold  Supine Sciatic Nerve Glide - 1 x daily - 7 x weekly - 1 sets - 10 reps  Supine Transversus Abdominis Bracing - Hands on Stomach - 1 x daily - 7 x weekly - 10 reps - 3 sets  Hooklying Isometric Hip Flexion - 1 x daily - 7 x weekly - 1 sets - 5 reps - 5 hold  Standing Hip Hinge with Dowel - 1 x daily - 7 x weekly - 1 sets - 10 reps  Supine March - 1 x daily - 7 x weekly - 1 sets - 10 reps  Supine Butterfly Groin Stretch - 2 x daily - 7 x weekly - 2 sets - 20 hold  Sidelying Hip Abduction - 1 x daily - 7 x weekly - 2 sets - 10 reps

## 2020-01-14 ENCOUNTER — Encounter: Payer: Self-pay | Admitting: Physical Therapy

## 2020-01-14 ENCOUNTER — Other Ambulatory Visit: Payer: Self-pay

## 2020-01-14 ENCOUNTER — Ambulatory Visit: Payer: No Typology Code available for payment source | Admitting: Physical Therapy

## 2020-01-14 DIAGNOSIS — M545 Low back pain, unspecified: Secondary | ICD-10-CM

## 2020-01-14 DIAGNOSIS — M6281 Muscle weakness (generalized): Secondary | ICD-10-CM

## 2020-01-14 NOTE — Therapy (Signed)
[facility] [address] [place], [place], [address] Phone: [phone]   Fax:  [phone]  Physical Therapy Treatment  Patient Details  Name: [name] MRN: [id number] Date of Birth: 1946-01-19 Referring Provider (PT): Dr. Saintclair Halsted   Encounter Date: 01/14/2020  PT End of Session - 01/14/20 1445    Visit Number  3    Number of Visits  15    Date for PT Re-Evaluation  03/04/20    Authorization Type  VA approved Eval + 14 visits    ends 06/03/20    PT Start Time  [id number]    PT Stop Time  1525    PT Time Calculation (min)  40 min    Activity Tolerance  Patient tolerated treatment well    Behavior During Therapy  [facility] for tasks assessed/performed       Past Medical History:  Diagnosis Date  . Cardiac murmur   . Hyperlipidemia   . Prostate cancer [facility])     Past Surgical History:  Procedure Laterality Date  . HAND SURGERY     4-5 times  . INGUINAL HERNIA REPAIR    . KNEE ARTHROSCOPY     left  . NASAL SEPTUM SURGERY    . PROSTATECTOMY    . TONSILLECTOMY      There were no vitals filed for this visit.  Subjective Assessment - 01/14/20 1452    Subjective  Pt reports no new changes since last visit. He is anxious to return to yard. "I have been tickled to death about my back since my surgery".    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  No/denies    Pain Score  0-No pain         [facility] PT Assessment - 01/14/20 0001      Assessment   Medical Diagnosis  lumbar surgery for ruptured disc    Referring Provider (PT)  Dr. Saintclair Halsted    Onset Date/Surgical Date  12/10/19    Next MD Visit  MD at the end of April     Prior  Therapy  hand therapy many years ago        [facility] Adult PT Treatment/Exercise - 01/14/20 0001      Lumbar Exercises: Stretches   Passive Hamstring Stretch  Right;Left;2 reps;30 seconds    Single Knee to Chest Stretch  Right;Left;1 rep;20 seconds    Hip Flexor Stretch  Right;Left;2 reps;20 seconds    Piriformis Stretch  Right;Left;2 reps;20 seconds    Other Lumbar Stretch Exercise  supine butterfly stretch x 20 sec x 2 reps      Lumbar Exercises: Aerobic   Nustep  L3: 5.5 min  - PTA present to discuss progress.       Lumbar Exercises: Standing   Other Standing Lumbar Exercises  wall push ups x 10 reps; bilat shoulder ext to neutral, 3 sec hold, with core engaged x 10 reps    Other Standing Lumbar Exercises  single arm row (like starting lawn mower ) x 10 reps each side (core engaged)      Lumbar Exercises: Seated   Sit  to Stand  5 reps   with dowel against back and hip hinge.     Lumbar Exercises: Supine   Bridge  10 reps;3 seconds   core engaged, cues to breathe     Lumbar Exercises: Quadruped   Opposite Arm/Leg Raise  Right arm/Left leg;Left arm/Right leg;5 reps;2 seconds             PT Education - 01/14/20 1516    Education Details  HEP - added hip flexor stretch. Pt educated on spinal pressure in relation to body positions (shown chart with pics)   Person(s) Educated  Patient    Methods  Explanation;Handout    Comprehension  Verbalized understanding       PT Short Term Goals - 01/08/20 1942      PT SHORT TERM GOAL #1   Title  The patient will demonstrate understanding of initial HEP to promote healing and safe return to function    Time  4    Period  Weeks    Status  New    Target Date  02/05/20      PT SHORT TERM GOAL #2   Title  The patient will demonstrate proper hip hinge technique with sit to stand and picking up objects from the floor/ground < 10#    Time  4    Period  Weeks    Status  New      PT SHORT TERM GOAL #3   Title  Improved lumbar ROM  with flexion to 40 degrees, extension to 10 degrees and sidebending to 25 degrees needed for return to yard work    Time  4    Period  Weeks    Status  New        PT Long Term Goals - 01/08/20 1946      PT LONG TERM GOAL #1   Title  The patient will be independent in safe self progression of HEP    Time  8    Period  Weeks    Status  New    Target Date  03/04/20      PT LONG TERM GOAL #2   Title  The patient will be able to walk 2 miles with pain level 2/10 or less    Time  8    Period  Weeks    Status  New      PT LONG TERM GOAL #3   Title  The patient will have grossly 4+/5 lumbo/pelvic/hip core strength needed for medium lifting and yard work    Time  8    Period  Weeks    Status  New      PT LONG TERM GOAL #4   Title  The patient will be able to return to 75% of usual outdoor yard activities with pain level 3/10 or less    Time  8    Period  Weeks    Status  New      PT LONG TERM GOAL #5   Title  FOTO functional outcome score improved from 51% to 40%    Time  8    Period  Weeks    Status  New            Plan - 01/14/20 1449    Clinical Impression Statement  Pt tolerated standing core work without pain or difficulty.  Hamstrings remain tight; shown modified stretch in supine. Pt educated regarding spinal pressure in relation to body positions; verbalized understanding.  Progressing  well towards LTGs.    Personal Factors and Comorbidities  Age;Comorbidity 1;Comorbidity 2    Comorbidities  PTSD; history of prostate cancer    Examination-Activity Limitations  Carry;Lift;Squat;Stairs;Stand;Locomotion Level    Examination-Participation Restrictions  Community Activity;Yard Work    Stability/Clinical Decision Making  Stable/Uncomplicated    Rehab Potential  Good    PT Frequency  2x / week    PT Duration  8 weeks    PT Treatment/Interventions  ADLs/Self Care Home Management;Electrical Stimulation;Cryotherapy;Moist Heat;Neuromuscular re-education;Therapeutic  exercise;Therapeutic activities;Patient/family education;Manual techniques;Dry needling;Taping    PT Next Visit Plan  Progression of core stabilization exercises,continue wall pushups with eventual progression to countertop.    PT Home Exercise Plan  Access Code: [facility]    Consulted and Agree with Plan of Care  Patient       Patient will benefit from skilled therapeutic intervention in order to improve the following deficits and impairments:  Decreased range of motion, Pain, Decreased activity tolerance, Impaired perceived functional ability, Decreased strength, Impaired flexibility  Visit Diagnosis: Acute left-sided low back pain, unspecified whether sciatica present  Muscle weakness (generalized)     Problem List There are no problems to display for this patient.  Kerin Perna, PTA 01/14/20 3:42 PM  [facility] [address] [place], [place], [address] Phone: [phone]   Fax:  [phone]  Name: [name] MRN: [id number] Date of Birth: [birth year]

## 2020-01-16 ENCOUNTER — Encounter: Payer: Self-pay | Admitting: Physical Therapy

## 2020-01-16 ENCOUNTER — Other Ambulatory Visit: Payer: Self-pay

## 2020-01-16 ENCOUNTER — Ambulatory Visit: Payer: No Typology Code available for payment source | Admitting: Physical Therapy

## 2020-01-16 DIAGNOSIS — M6281 Muscle weakness (generalized): Secondary | ICD-10-CM

## 2020-01-16 DIAGNOSIS — M545 Low back pain: Secondary | ICD-10-CM | POA: Diagnosis not present

## 2020-01-16 NOTE — Therapy (Signed)
[facility] [address], [place], [place], [address] Phone: [phone]   Fax:  [phone]  Physical Therapy Treatment  Patient Details  Name: [name] MRN: [id number] Date of Birth: 10-Nov-1945 Referring Provider (PT): Dr. Saintclair Halsted   Encounter Date: 01/16/2020  PT End of Session - 01/16/20 1439    Visit Number  4    Number of Visits  15    Date for PT Re-Evaluation  03/04/20    Authorization Type  VA approved Eval + 14 visits    ends 06/03/20    PT Start Time  1439    PT Stop Time  1524    PT Time Calculation (min)  45 min    Activity Tolerance  Patient tolerated treatment well    Behavior During Therapy  [facility] for tasks assessed/performed       Past Medical History:  Diagnosis Date  . Cardiac murmur   . Hyperlipidemia   . Prostate cancer [facility])     Past Surgical History:  Procedure Laterality Date  . HAND SURGERY     4-5 times  . INGUINAL HERNIA REPAIR    . KNEE ARTHROSCOPY     left  . NASAL SEPTUM SURGERY    . PROSTATECTOMY    . TONSILLECTOMY      There were no vitals filed for this visit.  Subjective Assessment - 01/16/20 1441    Subjective  Feelin good. No pain, just some tight side of my back/waist muscles.    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  No/denies    Multiple Pain Sites  No                       [facility] Adult PT Treatment/Exercise - 01/16/20 0001      Lumbar Exercises: Stretches   Active Hamstring Stretch  Right;Left;2 reps;30 seconds    Single Knee to Chest Stretch  Right;Left;2 reps;20 seconds    Hip Flexor Stretch  Right;Left;2 reps;20 seconds    Piriformis Stretch  Right;Left;2 reps;20 seconds    Other Lumbar Stretch Exercise  supine butterfly stretch  x 20 sec x 2 reps    Other Lumbar Stretch Exercise  Glute medius stretch 2x 20 sec      Lumbar Exercises: Aerobic   Nustep  L3 x 8 min with PTA present to discuss status.      Lumbar Exercises: Standing   Row  --   5# 15x Bil using countertop for support, TC for thoracic ext   Other Standing Lumbar Exercises  counter top push ups 2x10       Lumbar Exercises: Seated   Sit to Stand  10 reps    Other Seated Lumbar Exercises  5# shoulder to shoulder with core stabilization 10x sitting on black pad    TC for more vertical posture     Lumbar Exercises: Supine   Bridge  10 reps;3 seconds   core engaged, cues to breathe   Straight Leg Raise  5 reps   Bil   Straight Leg Raises Limitations  VC for core contraction. VC to slightly bend LT knee to shorten lever arm  secondary to weakness  PT Short Term Goals - 01/08/20 1942      PT SHORT TERM GOAL #1   Title  The patient will demonstrate understanding of initial HEP to promote healing and safe return to function    Time  4    Period  Weeks    Status  New    Target Date  02/05/20      PT SHORT TERM GOAL #2   Title  The patient will demonstrate proper hip hinge technique with sit to stand and picking up objects from the floor/ground < 10#    Time  4    Period  Weeks    Status  New      PT SHORT TERM GOAL #3   Title  Improved lumbar ROM with flexion to 40 degrees, extension to 10 degrees and sidebending to 25 degrees needed for return to yard work    Time  4    Period  Weeks    Status  New        PT Long Term Goals - 01/08/20 1946      PT LONG TERM GOAL #1   Title  The patient will be independent in safe self progression of HEP    Time  8    Period  Weeks    Status  New    Target Date  03/04/20      PT LONG TERM GOAL #2   Title  The patient will be able to walk 2 miles with pain level 2/10 or less    Time  8    Period  Weeks    Status  New      PT LONG TERM GOAL #3   Title  The patient will have  grossly 4+/5 lumbo/pelvic/hip core strength needed for medium lifting and yard work    Time  8    Period  Weeks    Status  New      PT LONG TERM GOAL #4   Title  The patient will be able to return to 75% of usual outdoor yard activities with pain level 3/10 or less    Time  8    Period  Weeks    Status  New      PT LONG TERM GOAL #5   Title  FOTO functional outcome score improved from 51% to 40%    Time  8    Period  Weeks    Status  New            Plan - 01/16/20 1439    Clinical Impression Statement  Pt presents today with no pain and doing "very well" in general. Pt able to tolerate flexibility, and core stabilization in both supine and standing. Pt will return to MD in two Wednesdays.    Personal Factors and Comorbidities  Age;Comorbidity 1;Comorbidity 2    Comorbidities  PTSD; history of prostate cancer    Examination-Activity Limitations  Carry;Lift;Squat;Stairs;Stand;Locomotion Level    Examination-Participation Restrictions  Community Activity;Yard Work    Stability/Clinical Decision Making  Stable/Uncomplicated    Rehab Potential  Good    PT Frequency  2x / week    PT Duration  8 weeks    PT Treatment/Interventions  ADLs/Self Care Home Management;Electrical Stimulation;Cryotherapy;Moist Heat;Neuromuscular re-education;Therapeutic exercise;Therapeutic activities;Patient/family education;Manual techniques;Dry needling;Taping    PT Next Visit Plan  Progression of core stabilization exercises    PT Home Exercise Plan  Access Code: [facility]    Consulted and Agree with Plan of Care  Patient       Patient will benefit from skilled therapeutic intervention in order to improve the following deficits and impairments:  Decreased range of motion, Pain, Decreased activity tolerance, Impaired perceived functional ability, Decreased strength, Impaired flexibility  Visit Diagnosis: Muscle weakness (generalized)     Problem List There are no problems to display for this  patient.   Roscoe Witts, PTA 01/16/2020, 3:24 PM  [facility] [address] [place], [place], [address] Phone: [phone]   Fax:  [phone]  Name: [name] MRN: [id number] Date of Birth: May 25, 1946

## 2020-01-21 ENCOUNTER — Encounter: Payer: Self-pay | Admitting: Physical Therapy

## 2020-01-21 ENCOUNTER — Ambulatory Visit: Payer: No Typology Code available for payment source | Admitting: Physical Therapy

## 2020-01-21 ENCOUNTER — Other Ambulatory Visit: Payer: Self-pay

## 2020-01-21 DIAGNOSIS — M545 Low back pain, unspecified: Secondary | ICD-10-CM

## 2020-01-21 DIAGNOSIS — M6281 Muscle weakness (generalized): Secondary | ICD-10-CM

## 2020-01-21 NOTE — Therapy (Signed)
[facility] [address] [place], [place], [address] Phone: [phone]   Fax:  [phone]  Physical Therapy Treatment  Patient Details  Name: [name] MRN: [id number] Date of Birth: 1945-12-04 Referring Provider (PT): Dr. Saintclair Halsted   Encounter Date: 01/21/2020  PT End of Session - 01/21/20 1540    Visit Number  5    Number of Visits  15    Date for PT Re-Evaluation  03/04/20    Authorization Type  VA approved Eval + 14 visits    ends 06/03/20    PT Start Time  1450    PT Stop Time  1530    PT Time Calculation (min)  40 min    Activity Tolerance  Patient tolerated treatment well    Behavior During Therapy  [facility] for tasks assessed/performed       Past Medical History:  Diagnosis Date  . Cardiac murmur   . Hyperlipidemia   . Prostate cancer [facility])     Past Surgical History:  Procedure Laterality Date  . HAND SURGERY     4-5 times  . INGUINAL HERNIA REPAIR    . KNEE ARTHROSCOPY     left  . NASAL SEPTUM SURGERY    . PROSTATECTOMY    . TONSILLECTOMY      There were no vitals filed for this visit.  Subjective Assessment - 01/21/20 1449    Subjective  Pt reports he did his 2 mile walk around parking lot, during a recent out of town trip. He is not sitting more than 30 min at a time. Biggest complaint is stiffness.    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  No/denies    Pain Score  0-No pain         [facility] PT Assessment - 01/21/20 0001      Assessment   Medical Diagnosis  lumbar surgery for ruptured disc    Referring Provider (PT)  Dr. Saintclair Halsted    Onset Date/Surgical Date  12/10/19    Next MD Visit  MD at the end of April     Prior Therapy  hand therapy many years ago        [facility] Adult PT  Treatment/Exercise - 01/21/20 0001      Lumbar Exercises: Stretches   Passive Hamstring Stretch  Right;Left;2 reps;20 seconds   supine with strap, opp knee flexed.    Single Knee to Chest Stretch  Right;Left;2 reps;20 seconds    Piriformis Stretch  Right;Left;2 reps;20 seconds      Lumbar Exercises: Aerobic   Nustep  L4: 5 min       Lumbar Exercises: Standing   Row  Strengthening;Both;10 reps    Theraband Level (Row)  Level 3 (Green)    Other Standing Lumbar Exercises  functional squats (hip hinge) to touch 5# wt to 6" box x 10; hip hinge with 2# in each hand x 10     Other Standing Lumbar Exercises  anti-rotation side step with green band, (with arms out/in from core) x 5 reps each side.       Lumbar Exercises: Seated   Other Seated Lumbar Exercises  5# shoulder to opp knee (mini wood chop) with core stabilization  10x sitting on black pad       Lumbar Exercises: Supine   Bent Knee Raise  10 reps   with ab set   Dead Bug  10 reps   with ab set   Bridge  10 reps;5 seconds   core engaged, cues to breathe   Straight Leg Raise  10 reps    Other Supine Lumbar Exercises  --      Lumbar Exercises: Prone   Other Prone Lumbar Exercises  prone on elbows with unilateral arm reach x 10      Lumbar Exercises: Quadruped   Opposite Arm/Leg Raise  Right arm/Left leg;Left arm/Right leg;5 reps;2 seconds               PT Short Term Goals - 01/08/20 1942      PT SHORT TERM GOAL #1   Title  The patient will demonstrate understanding of initial HEP to promote healing and safe return to function    Time  4    Period  Weeks    Status  New    Target Date  02/05/20      PT SHORT TERM GOAL #2   Title  The patient will demonstrate proper hip hinge technique with sit to stand and picking up objects from the floor/ground < 10#    Time  4    Period  Weeks    Status  New      PT SHORT TERM GOAL #3   Title  Improved lumbar ROM with flexion to 40 degrees, extension to 10 degrees and  sidebending to 25 degrees needed for return to yard work    Time  4    Period  Weeks    Status  New        PT Long Term Goals - 01/21/20 1459      PT LONG TERM GOAL #1   Title  The patient will be independent in safe self progression of HEP    Time  8    Period  Weeks    Status  On-going      PT LONG TERM GOAL #2   Title  The patient will be able to walk 2 miles with pain level 2/10 or less    Time  8    Period  Weeks    Status  Achieved      PT LONG TERM GOAL #3   Title  The patient will have grossly 4+/5 lumbo/pelvic/hip core strength needed for medium lifting and yard work    Time  8    Period  Weeks    Status  On-going      PT LONG TERM GOAL #4   Title  The patient will be able to return to 75% of usual outdoor yard activities with pain level 3/10 or less    Time  8    Period  Weeks    Status  On-going      PT LONG TERM GOAL #5   Title  FOTO functional outcome score improved from 51% to 40%    Time  8    Period  Weeks    Status  On-going            Plan - 01/21/20 1506    Clinical Impression Statement  Pt tolerated exercises with increased resistance, increased reps, without any increase in discomfort.  Pt has met LTG#2, per his report and is progressing well towards remaining goals.  Pt has upcoming MD  appt next week.    Personal Factors and Comorbidities  Age;Comorbidity 1;Comorbidity 2    Comorbidities  PTSD; history of prostate cancer    Examination-Activity Limitations  Carry;Lift;Squat;Stairs;Stand;Locomotion Level    Examination-Participation Restrictions  Community Activity;Yard Work    Stability/Clinical Decision Making  Stable/Uncomplicated    Rehab Potential  Good    PT Frequency  2x / week    PT Duration  8 weeks    PT Treatment/Interventions  ADLs/Self Care Home Management;Electrical Stimulation;Cryotherapy;Moist Heat;Neuromuscular re-education;Therapeutic exercise;Therapeutic activities;Patient/family education;Manual techniques;Dry  needling;Taping    PT Next Visit Plan  Progression of core stabilization exercises. MD note next Monday.    PT Home Exercise Plan  Access Code: [facility]    Consulted and Agree with Plan of Care  Patient       Patient will benefit from skilled therapeutic intervention in order to improve the following deficits and impairments:  Decreased range of motion, Pain, Decreased activity tolerance, Impaired perceived functional ability, Decreased strength, Impaired flexibility  Visit Diagnosis: Muscle weakness (generalized)  Acute left-sided low back pain, unspecified whether sciatica present     Problem List There are no problems to display for this patient.  Kerin Perna, PTA 01/21/20 3:42 PM  [facility] [address] [place], [place], [address] Phone: [phone]   Fax:  [phone]  Name: [name] MRN: [id number] Date of Birth: [birth year]

## 2020-01-23 ENCOUNTER — Encounter: Payer: Self-pay | Admitting: Physical Therapy

## 2020-01-23 ENCOUNTER — Other Ambulatory Visit: Payer: Self-pay

## 2020-01-23 ENCOUNTER — Ambulatory Visit: Payer: No Typology Code available for payment source | Admitting: Physical Therapy

## 2020-01-23 DIAGNOSIS — M545 Low back pain, unspecified: Secondary | ICD-10-CM

## 2020-01-23 DIAGNOSIS — M6281 Muscle weakness (generalized): Secondary | ICD-10-CM

## 2020-01-23 NOTE — Therapy (Signed)
[facility] [address], [place], [place], [address] Phone: [phone]   Fax:  [phone]  Physical Therapy Treatment  Patient Details  Name: [name] MRN: [id number] Date of Birth: [birth year] Referring Provider (PT): Dr. Saintclair Halsted   Encounter Date: 01/23/2020  PT End of Session - 01/23/20 1527    Visit Number  6    Number of Visits  15    Date for PT Re-Evaluation  03/04/20    Authorization Type  VA approved Eval + 14 visits    ends 06/03/20    PT Start Time  1527    PT Stop Time  1605    PT Time Calculation (min)  38 min    Activity Tolerance  Patient tolerated treatment well    Behavior During Therapy  [facility] for tasks assessed/performed       Past Medical History:  Diagnosis Date  . Cardiac murmur   . Hyperlipidemia   . Prostate cancer [facility])     Past Surgical History:  Procedure Laterality Date  . HAND SURGERY     4-5 times  . INGUINAL HERNIA REPAIR    . KNEE ARTHROSCOPY     left  . NASAL SEPTUM SURGERY    . PROSTATECTOMY    . TONSILLECTOMY      There were no vitals filed for this visit.  Subjective Assessment - 01/23/20 1528    Subjective  No new complaints. No pain. Sees MD next Tuesday.    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  No/denies    Multiple Pain Sites  No                       [facility] Adult PT Treatment/Exercise - 01/23/20 0001      Lumbar Exercises: Stretches   Active Hamstring Stretch  Right;Left;2 reps;30 seconds   Seated   Single Knee to Chest Stretch  Right;Left;2 reps;20 seconds    Hip Flexor Stretch  Right;Left;2 reps;20 seconds    Other Lumbar Stretch Exercise  Glute medius stretch 2x 20 sec      Lumbar Exercises: Aerobic   Nustep  L4 x 8 min at end of session. PTA present      Lumbar  Exercises: Standing   Row  Strengthening;Both;15 reps;Theraband    Theraband Level (Row)  Level 3 (Green)    Other Standing Lumbar Exercises  Green band side step ( 2 steps) hold x5sec 10x total Both directions      Lumbar Exercises: Supine   Bridge  10 reps;5 seconds    Bridge with clamshell  --   6x   Straight Leg Raise  10 reps    Straight Leg Raises Limitations  VC for core contraction. VC to slightly bend LT knee to shorten lever arm  secondary to weakness      Knee/Hip Exercises: Sidelying   Hip ABduction  AROM;Strengthening;Both;1 set;10 reps               PT Short Term Goals - 01/08/20 1942      PT SHORT TERM GOAL #1   Title  The patient will demonstrate understanding of initial HEP to promote healing and safe return to function    Time  4    Period  Weeks    Status  New    Target Date  02/05/20      PT SHORT TERM GOAL #2   Title  The patient will demonstrate proper hip hinge technique with sit to stand and picking up objects from the floor/ground < 10#    Time  4    Period  Weeks    Status  New      PT SHORT TERM GOAL #3   Title  Improved lumbar ROM with flexion to 40 degrees, extension to 10 degrees and sidebending to 25 degrees needed for return to yard work    Time  4    Period  Weeks    Status  New        PT Long Term Goals - 01/21/20 1459      PT LONG TERM GOAL #1   Title  The patient will be independent in safe self progression of HEP    Time  8    Period  Weeks    Status  On-going      PT LONG TERM GOAL #2   Title  The patient will be able to walk 2 miles with pain level 2/10 or less    Time  8    Period  Weeks    Status  Achieved      PT LONG TERM GOAL #3   Title  The patient will have grossly 4+/5 lumbo/pelvic/hip core strength needed for medium lifting and yard work    Time  8    Period  Weeks    Status  On-going      PT LONG TERM GOAL #4   Title  The patient will be able to return to 75% of usual outdoor yard activities with  pain level 3/10 or less    Time  8    Period  Weeks    Status  On-going      PT LONG TERM GOAL #5   Title  FOTO functional outcome score improved from 51% to 40%    Time  8    Period  Weeks    Status  On-going            Plan - 01/23/20 1528    Clinical Impression Statement  No pain, doing well with all his exercises. Most weakness shows in LT hip abduction. Pt performs all exercises painfree.    Personal Factors and Comorbidities  Age;Comorbidity 1;Comorbidity 2    Comorbidities  PTSD; history of prostate cancer    Examination-Activity Limitations  Carry;Lift;Squat;Stairs;Stand;Locomotion Level    Examination-Participation Restrictions  Community Activity;Yard Work    Stability/Clinical Decision Making  Stable/Uncomplicated    Rehab Potential  Good    PT Frequency  2x / week    PT Duration  8 weeks    PT Treatment/Interventions  ADLs/Self Care Home Management;Electrical Stimulation;Cryotherapy;Moist Heat;Neuromuscular re-education;Therapeutic exercise;Therapeutic activities;Patient/family education;Manual techniques;Dry needling;Taping    PT Next Visit Plan  wrtie MD note next session    PT Home Exercise Plan  Access Code: [facility]    Consulted and Agree with Plan of Care  Patient       Patient will benefit from skilled therapeutic intervention in order to improve the following deficits and impairments:  Decreased range of motion, Pain, Decreased activity tolerance, Impaired perceived functional ability, Decreased strength, Impaired flexibility  Visit Diagnosis: Muscle weakness (generalized)  Acute left-sided low back pain, unspecified whether sciatica present     Problem List There are no problems to  display for this patient.   Louie Meaders, PTA 01/23/2020, 4:01 PM  [facility] [address], [place], [place], [address] Phone: [phone]   Fax:  [phone]  Name: [name] MRN:  [id number] Date of Birth: 1946/02/07

## 2020-01-28 ENCOUNTER — Encounter: Payer: Self-pay | Admitting: Physical Therapy

## 2020-01-28 ENCOUNTER — Ambulatory Visit: Payer: No Typology Code available for payment source | Admitting: Physical Therapy

## 2020-01-28 ENCOUNTER — Other Ambulatory Visit: Payer: Self-pay

## 2020-01-28 DIAGNOSIS — M6281 Muscle weakness (generalized): Secondary | ICD-10-CM

## 2020-01-28 DIAGNOSIS — M545 Low back pain, unspecified: Secondary | ICD-10-CM

## 2020-01-28 NOTE — Therapy (Signed)
[facility] [address], [place], [place], [address] Phone: [phone]   Fax:  [phone]  Physical Therapy Treatment  Patient Details  Name: [name] MRN: [id number] Date of Birth: [birth year] Referring Provider (PT): Dr. Saintclair Halsted   Encounter Date: 01/28/2020  PT End of Session - 01/28/20 1438    Visit Number  7    Number of Visits  15    Date for PT Re-Evaluation  03/04/20    Authorization Type  VA approved Eval + 14 visits    ends 06/03/20    PT Start Time  [id number]    PT Stop Time  1518    PT Time Calculation (min)  40 min    Activity Tolerance  Patient tolerated treatment well    Behavior During Therapy  [facility] for tasks assessed/performed       Past Medical History:  Diagnosis Date  . Cardiac murmur   . Hyperlipidemia   . Prostate cancer [facility])     Past Surgical History:  Procedure Laterality Date  . HAND SURGERY     4-5 times  . INGUINAL HERNIA REPAIR    . KNEE ARTHROSCOPY     left  . NASAL SEPTUM SURGERY    . PROSTATECTOMY    . TONSILLECTOMY      There were no vitals filed for this visit.  Subjective Assessment - 01/28/20 1442    Subjective  I see the docotor tomorrow. I am hoping I get to do something!    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  No/denies   Just stiffness in my back   Multiple Pain Sites  No         [facility] PT Assessment - 01/28/20 0001      Assessment   Medical Diagnosis  lumbar surgery for ruptured disc    Referring Provider (PT)  Dr. Saintclair Halsted    Onset Date/Surgical Date  12/10/19      Observation/Other Assessments   Focus on Therapeutic Outcomes (FOTO)   30% limitation      AROM   Lumbar Extension  20      Strength   Right Hip Flexion  5/5    Right Hip ABduction  4+/5    Left Hip Flexion  4/5    Left  Hip ABduction  4+/5    Lumbar Flexion  4/5    Lumbar Extension  5/5                   [facility] Adult PT Treatment/Exercise - 01/28/20 0001      Lumbar Exercises: Aerobic   Nustep  L4 x 10 min with PTA present      Knee/Hip Exercises: Supine   Straight Leg Raises  Strengthening;Left;2 sets;10 reps    Straight Leg Raises Limitations  2# added             PT Education - 01/28/20 1512    Education Details  Resisted standing hip flexion for HEP    Person(s) Educated  Patient    Methods  Explanation;Demonstration;Tactile cues;Handout    Comprehension  Returned demonstration;Verbalized understanding       PT Short Term Goals - 01/28/20 1503      PT SHORT TERM GOAL #1   Title  The  patient will demonstrate understanding of initial HEP to promote healing and safe return to function    Time  4    Period  Weeks    Status  Achieved    Target Date  02/05/20        PT Long Term Goals - 01/28/20 1504      PT LONG TERM GOAL #1   Title  The patient will be independent in safe self progression of HEP    Time  8    Period  Weeks    Status  On-going      PT LONG TERM GOAL #2   Title  The patient will be able to walk 2 miles with pain level 2/10 or less    Time  8    Period  Weeks    Status  Achieved      PT LONG TERM GOAL #3   Title  The patient will have grossly 4+/5 lumbo/pelvic/hip core strength needed for medium lifting and yard work    Time  8    Period  Weeks    Status  On-going      PT LONG TERM GOAL #4   Title  The patient will be able to return to 75% of usual outdoor yard activities with pain level 3/10 or less    Time  8    Period  Weeks    Status  On-going      PT LONG TERM GOAL #5   Title  FOTO functional outcome score improved from 51% to 40%    Time  8    Period  Weeks    Status  Achieved            Plan - 01/28/20 1438    Clinical Impression Statement  Pt's FOTO improved greatly, MMT also improved. Lt hip flexion remains weaker  than the right. Pt is compliant with HEP and is daily walking. He is highly motivated to get back to yardwork.    Personal Factors and Comorbidities  Age;Comorbidity 1;Comorbidity 2    Comorbidities  PTSD; history of prostate cancer    Examination-Activity Limitations  Carry;Lift;Squat;Stairs;Stand;Locomotion Level    Examination-Participation Restrictions  Community Activity;Yard Work    Stability/Clinical Decision Making  Stable/Uncomplicated    Rehab Potential  Good    PT Frequency  2x / week    PT Duration  8 weeks    PT Treatment/Interventions  ADLs/Self Care Home Management;Electrical Stimulation;Cryotherapy;Moist Heat;Neuromuscular re-education;Therapeutic exercise;Therapeutic activities;Patient/family education;Manual techniques;Dry needling;Taping    PT Next Visit Plan  See what MD says    PT Home Exercise Plan  Access Code: [facility]    Consulted and Agree with Plan of Care  Patient       Patient will benefit from skilled therapeutic intervention in order to improve the following deficits and impairments:  Decreased range of motion, Pain, Decreased activity tolerance, Impaired perceived functional ability, Decreased strength, Impaired flexibility  Visit Diagnosis: Acute left-sided low back pain, unspecified whether sciatica present  Muscle weakness (generalized)     Problem List There are no problems to display for this patient.   Sindi Beckworth, PTA 01/28/2020, 3:20 PM  [facility] [address], [place], [place], [address] Phone: [phone]   Fax:  [phone]  Name: Theodric Garbo MRN: [id number] Date of Birth: Feb 27, 1946

## 2020-01-30 ENCOUNTER — Encounter: Payer: Self-pay | Admitting: Physical Therapy

## 2020-01-30 ENCOUNTER — Other Ambulatory Visit: Payer: Self-pay

## 2020-01-30 ENCOUNTER — Ambulatory Visit: Payer: No Typology Code available for payment source | Admitting: Physical Therapy

## 2020-01-30 DIAGNOSIS — M545 Low back pain, unspecified: Secondary | ICD-10-CM

## 2020-01-30 DIAGNOSIS — M6281 Muscle weakness (generalized): Secondary | ICD-10-CM

## 2020-01-30 NOTE — Therapy (Signed)
[facility] [address] [place], [place], [address] Phone: [phone]   Fax:  [phone]  Physical Therapy Treatment  Patient Details  Name: [name] MRN: [id number] Date of Birth: [birth year] Referring Provider (PT): Dr. Saintclair Halsted   Encounter Date: 01/30/2020  PT End of Session - 01/30/20 1442    Visit Number  8    Number of Visits  15    Date for PT Re-Evaluation  03/04/20    Authorization Type  VA approved Eval + 14 visits    ends 06/03/20    PT Start Time  1435    PT Stop Time  1516    PT Time Calculation (min)  41 min    Activity Tolerance  Patient tolerated treatment well    Behavior During Therapy  [facility] for tasks assessed/performed       Past Medical History:  Diagnosis Date  . Cardiac murmur   . Hyperlipidemia   . Prostate cancer [facility])     Past Surgical History:  Procedure Laterality Date  . HAND SURGERY     4-5 times  . INGUINAL HERNIA REPAIR    . KNEE ARTHROSCOPY     left  . NASAL SEPTUM SURGERY    . PROSTATECTOMY    . TONSILLECTOMY      There were no vitals filed for this visit.  Subjective Assessment - 01/30/20 1443    Subjective  MD super pleased with my progress. He said to let PT guide progression of lifting.    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  No/denies    Multiple Pain Sites  No                       [facility] Adult PT Treatment/Exercise - 01/30/20 0001      Self-Care   Self-Care  --   How to use tennsi ball at home for tight back muscles   ADL's  Guidelines for yardwork; pt will push mow 1/2 yard and assess how he feels: he will buy a long reacher for picking up sticks, will use his pad to kneel for weeding.      Therapeutic Activites    Therapeutic Activities  Lifting    Lifting  10# box  from off the 6" box to waist 10x 12# box 10x 2       Lumbar Exercises: Aerobic   Nustep  L4 x 10 min with PTA present      Lumbar Exercises: Standing   Row  Strengthening;Both;20 reps;Theraband    Theraband Level (Row)  Level 3 (Green)   added to HEP   Shoulder Extension  Strengthening;Both;10 reps;Theraband   added to HEP   Theraband Level (Shoulder Extension)  Level 3 [facility] Cowden)             PT Education - 01/30/20 1517    Education Details  Green band standing rows and extensions for HEP    Person(s) Educated  Patient    Methods  Explanation;Demonstration;Handout   issued green band   Comprehension  Returned demonstration;Verbalized understanding       PT Short Term Goals - 01/28/20 1503      PT SHORT TERM GOAL #1   Title  The patient will demonstrate understanding of  initial HEP to promote healing and safe return to function    Time  4    Period  Weeks    Status  Achieved    Target Date  02/05/20        PT Long Term Goals - 01/28/20 1504      PT LONG TERM GOAL #1   Title  The patient will be independent in safe self progression of HEP    Time  8    Period  Weeks    Status  On-going      PT LONG TERM GOAL #2   Title  The patient will be able to walk 2 miles with pain level 2/10 or less    Time  8    Period  Weeks    Status  Achieved      PT LONG TERM GOAL #3   Title  The patient will have grossly 4+/5 lumbo/pelvic/hip core strength needed for medium lifting and yard work    Time  8    Period  Weeks    Status  On-going      PT LONG TERM GOAL #4   Title  The patient will be able to return to 75% of usual outdoor yard activities with pain level 3/10 or less    Time  8    Period  Weeks    Status  On-going      PT LONG TERM GOAL #5   Title  FOTO functional outcome score improved from 51% to 40%    Time  8    Period  Weeks    Status  Achieved            Plan - 01/30/20 1442    Clinical Impression Statement  MD pleased with pt's progress and  has left it up to our discretion progress of lifting. Pt was able to lift 12# 3x10 with good body mechanics and no pain. Educated pt on ways to proceed with his yardwork ( see self care). PTA also reviewed lumbar prtocetive body mechanics with pt regarding lifting.    Personal Factors and Comorbidities  Age;Comorbidity 1;Comorbidity 2    Comorbidities  PTSD; history of prostate cancer    Examination-Activity Limitations  Carry;Lift;Squat;Stairs;Stand;Locomotion Level    Examination-Participation Restrictions  Community Activity;Yard Work    Stability/Clinical Decision Making  Stable/Uncomplicated    Rehab Potential  Good    PT Frequency  2x / week    PT Duration  8 weeks    PT Treatment/Interventions  ADLs/Self Care Home Management;Electrical Stimulation;Cryotherapy;Moist Heat;Neuromuscular re-education;Therapeutic exercise;Therapeutic activities;Patient/family education;Manual techniques;Dry needling;Taping    PT Next Visit Plan  See how pt did with todays lifting. Consider 15# lift task. See if pt mowed and how he felt. focus on LT hip/quad strength.    PT Home Exercise Plan  Access Code: [facility]    Consulted and Agree with Plan of Care  Patient       Patient will benefit from skilled therapeutic intervention in order to improve the following deficits and impairments:  Decreased range of motion, Pain, Decreased activity tolerance, Impaired perceived functional ability, Decreased strength, Impaired flexibility  Visit Diagnosis: Acute left-sided low back pain, unspecified whether sciatica present  Muscle weakness (generalized)     Problem List There are no problems to display for this patient.   Alene Bergerson, PTA 01/30/2020, 3:20 PM  [facility] [address], [place], [place], [address] Phone: [phone]   Fax:  [phone]  Name: [name] MRN: [id number] Date of Birth: Sep 23, 1946 Access Code:  [email or web address]: [email or web address]: 04/28/2021Prepared by: Anderson Malta CochranExercises  Standing Hip Hinge with Dowel - 1 x daily - 7 x weekly - 1 sets - 10 reps  Standing Anti-Rotation Press with Anchored Resistance - 1 x daily - 7 x weekly - 1 sets - 5 reps  Sidelying Hip Abduction - 1 x daily - 7 x weekly - 2 sets - 10 reps  Supine March - 1 x daily - 7 x weekly - 1 sets - 10 reps  Supine Butterfly Groin Stretch - 2 x daily - 7 x weekly - 2 sets - 20 hold  Hooklying Hamstring Stretch with Strap - 2 x daily - 7 x weekly - 1 sets - 2 reps - 20 hold  Hooklying Single Knee to Chest Stretch - 2 x daily - 7 x weekly - 1 sets - 2 reps - 30 hold  Standing Hip Flexion - 2 x daily - 7 x weekly - 3 sets - 10 reps  Standing Row with Anchored Resistance - 1 x daily - 7 x weekly - 3 sets - 15 reps  Single Arm Shoulder Extension with Anchored Resistance - 1 x daily - 7 x weekly - 10 reps - 3 sets

## 2020-02-05 ENCOUNTER — Ambulatory Visit: Payer: No Typology Code available for payment source | Attending: Neurosurgery | Admitting: Physical Therapy

## 2020-02-05 ENCOUNTER — Other Ambulatory Visit: Payer: Self-pay

## 2020-02-05 ENCOUNTER — Encounter: Payer: Self-pay | Admitting: Physical Therapy

## 2020-02-05 DIAGNOSIS — M545 Low back pain, unspecified: Secondary | ICD-10-CM

## 2020-02-05 DIAGNOSIS — M6281 Muscle weakness (generalized): Secondary | ICD-10-CM | POA: Insufficient documentation

## 2020-02-05 NOTE — Therapy (Signed)
[facility] [address], [place], [place], [address] Phone: [phone]   Fax:  [phone]  Physical Therapy Treatment  Patient Details  Name: [name] MRN: [id number] Date of Birth: 1945/12/16 Referring Provider (PT): Dr. Saintclair Halsted   Encounter Date: 02/05/2020  PT End of Session - 02/05/20 1223    Visit Number  9    Number of Visits  15    Date for PT Re-Evaluation  03/04/20    Authorization Type  VA approved Eval + 14 visits    ends 06/03/20  10th visit progress note    PT Start Time  0930    PT Stop Time  1015    PT Time Calculation (min)  45 min    Activity Tolerance  Patient tolerated treatment well       Past Medical History:  Diagnosis Date  . Cardiac murmur   . Hyperlipidemia   . Prostate cancer [facility])     Past Surgical History:  Procedure Laterality Date  . HAND SURGERY     4-5 times  . INGUINAL HERNIA REPAIR    . KNEE ARTHROSCOPY     left  . NASAL SEPTUM SURGERY    . PROSTATECTOMY    . TONSILLECTOMY      There were no vitals filed for this visit.  Subjective Assessment - 02/05/20 0933    Subjective  I mowed 1/2 the yard and weedeating.  That seemed to go OK.  Just stiffness this morning.    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  No/denies    Pain Score  0-No pain    Pain Location  Back                       [facility] Adult PT Treatment/Exercise - 02/05/20 0001      Therapeutic Activites    Lifting  kneeling on mat table and then simulation of weeding on hands/knees       Lumbar Exercises: Stretches   Other Lumbar Stretch Exercise  doorway psoas stretch 3x 5       Lumbar Exercises: Aerobic   Nustep  L8 x 5 min with PT present to discuss status     Lumbar Exercises: Machines for Strengthening   Leg Press   seat 7 80# 15x; 40# single leg 15x       Lumbar Exercises: Standing   Other Standing Lumbar Exercises  red band kickstand position 5 reps each Pallof series: press forward, press up, press out and up, march, retro step right/left sides    Other Standing Lumbar Exercises  hip hinge with golf club;  10# dead lift to shin level 10x;  single leg dead lift 10# 7x right/left       Lumbar Exercises: Seated   Other Seated Lumbar Exercises  nerve floss 5x right/left              PT Education - 02/05/20 1222    Education Details  patient given recommendation/request for a reacher/grabber to pick up from the [facility]) Educated  Patient    Methods  Handout       PT Short Term Goals - 01/28/20 1503      PT SHORT TERM GOAL #1  Title  The patient will demonstrate understanding of initial HEP to promote healing and safe return to function    Time  4    Period  Weeks    Status  Achieved    Target Date  02/05/20        PT Long Term Goals - 01/28/20 1504      PT LONG TERM GOAL #1   Title  The patient will be independent in safe self progression of HEP    Time  8    Period  Weeks    Status  On-going      PT LONG TERM GOAL #2   Title  The patient will be able to walk 2 miles with pain level 2/10 or less    Time  8    Period  Weeks    Status  Achieved      PT LONG TERM GOAL #3   Title  The patient will have grossly 4+/5 lumbo/pelvic/hip core strength needed for medium lifting and yard work    Time  8    Period  Weeks    Status  On-going      PT LONG TERM GOAL #4   Title  The patient will be able to return to 75% of usual outdoor yard activities with pain level 3/10 or less    Time  8    Period  Weeks    Status  On-going      PT LONG TERM GOAL #5   Title  FOTO functional outcome score improved from 51% to 40%    Time  8    Period  Weeks    Status  Achieved            Plan - 02/05/20 1225    Clinical Impression Statement  The patient continues to progress  with return to function, pain reduction and general mobility.  He was able to mow 1/2 his yard and weed eat successfully.  He has not attempted weeding yet and normally kneels on a padded stand leans forward on hands/knees.  Simulated this activity today in the clinic without difficulty.  Good hip hinge with standard dead lifting from shin level however technique falters with single leg dead lifts resulting in "pulling in the back." Therapist closely monitoring response with all interventions.    Comorbidities  PTSD; history of prostate cancer    Rehab Potential  Good    PT Frequency  2x / week    PT Duration  8 weeks    PT Treatment/Interventions  ADLs/Self Care Home Management;Electrical Stimulation;Cryotherapy;Moist Heat;Neuromuscular re-education;Therapeutic exercise;Therapeutic activities;Patient/family education;Manual techniques;Dry needling;Taping    PT Next Visit Plan  do 10th visit progress note next visit;  lifting; gardening/yard work simulation;  left hip/quad strength    PT Home Exercise Plan  Access Code: [facility]       Patient will benefit from skilled therapeutic intervention in order to improve the following deficits and impairments:  Decreased range of motion, Pain, Decreased activity tolerance, Impaired perceived functional ability, Decreased strength, Impaired flexibility  Visit Diagnosis: Acute left-sided low back pain, unspecified whether sciatica present  Muscle weakness (generalized)     Problem List There are no problems to display for this patient.  [name], PT 02/05/20 1:58 PM Phone: [phone] Fax: [phone] [name] 02/05/2020, 1:57 PM   [facility] [address], [place], [place], [address] Phone: [phone]   Fax:  [phone]  Name: [name] MRN:  [id number] Date of Birth: Aug 14, 1946

## 2020-02-05 NOTE — Patient Instructions (Signed)
    Mr. [name] (DOB August 09, 2046) needs a reacher/grabber device for home use following adherence to lumbar surgical precautions.    Thank you,   [name] PT  [facility] [address], [place],  [address] Phone # [phone] Fax [phone]

## 2020-02-06 ENCOUNTER — Encounter: Payer: Self-pay | Admitting: Physical Therapy

## 2020-02-06 ENCOUNTER — Other Ambulatory Visit: Payer: Self-pay

## 2020-02-06 ENCOUNTER — Ambulatory Visit: Payer: No Typology Code available for payment source | Admitting: Physical Therapy

## 2020-02-06 DIAGNOSIS — M545 Low back pain, unspecified: Secondary | ICD-10-CM

## 2020-02-06 DIAGNOSIS — M6281 Muscle weakness (generalized): Secondary | ICD-10-CM

## 2020-02-06 NOTE — Therapy (Addendum)
[facility] [address] [place], [place], [address] Phone: [phone]   Fax:  [phone]  Physical Therapy Treatment  Patient Details  Name: [name] MRN: [id number] Date of Birth: Jan 09, 1946 Referring Provider (PT): Dr. Saintclair Halsted  Progress Note Reporting Period 01/08/20 to 02/06/20  See note below for Objective Data and Assessment of Progress/Goals.      Encounter Date: 02/06/2020  PT End of Session - 02/06/20 0935    Visit Number  10    Number of Visits  15    Date for PT Re-Evaluation  03/04/20    Authorization Type  VA approved Eval + 14 visits    ends 06/03/20  10th visit progress note    PT Start Time  0930    PT Stop Time  1010    PT Time Calculation (min)  40 min    Activity Tolerance  Patient tolerated treatment well    Behavior During Therapy  WFL for tasks assessed/performed       Past Medical History:  Diagnosis Date  . Cardiac murmur   . Hyperlipidemia   . Prostate cancer [facility])     Past Surgical History:  Procedure Laterality Date  . HAND SURGERY     4-5 times  . INGUINAL HERNIA REPAIR    . KNEE ARTHROSCOPY     left  . NASAL SEPTUM SURGERY    . PROSTATECTOMY    . TONSILLECTOMY      There were no vitals filed for this visit.  Subjective Assessment - 02/06/20 0937    Subjective  Push mowed entire yard and did fine, no significant issues. Gets tired and his muscles get a little sore.    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  No/denies    Multiple Pain Sites  No         [facility] PT Assessment - 02/06/20 0001      Assessment   Medical Diagnosis  lumbar surgery for ruptured disc    Referring Provider (PT)  Dr. Saintclair Halsted    Onset Date/Surgical Date  12/10/19      Strength   Right Hip ABduction  4+/5    Left Hip Flexion   --   4/5-4+/5   Left Hip ABduction  4+/5    Lumbar Flexion  --                   [facility] Adult PT Treatment/Exercise - 02/06/20 0001      Lumbar Exercises: Aerobic   Nustep  L8 x 10 min with PTA present      Lumbar Exercises: Machines for Strengthening   Leg Press  seat 7: Bil 80# 15x, 85# 15x, LTLE 40# 2x10      Lumbar Exercises: Standing   Other Standing Lumbar Exercises  red band kickstand position  reps each Pallof series: press forward, press up, press out and up, march, retro step right/left sides    Other Standing Lumbar Exercises  hip hinge with 10# wt touching 6" box 2x10      Lumbar Exercises: Seated   Other Seated Lumbar Exercises  Multifidus with red band 3 sec  2x10 Vc for posture    Pt will do this at home was given band to take home.  Knee/Hip Exercises: Standing   Other Standing Knee Exercises  Blue band side stepping 20 ft 4x, high knee marching 20 ft 4x                PT Short Term Goals - 01/28/20 1503      PT SHORT TERM GOAL #1   Title  The patient will demonstrate understanding of initial HEP to promote healing and safe return to function    Time  4    Period  Weeks    Status  Achieved    Target Date  02/05/20        PT Long Term Goals - 02/06/20 1017      PT LONG TERM GOAL #1   Title  The patient will be independent in safe self progression of HEP    Time  8    Period  Weeks    Status  On-going      PT LONG TERM GOAL #2   Title  The patient will be able to walk 2 miles with pain level 2/10 or less    Time  8    Period  Weeks    Status  Achieved      PT LONG TERM GOAL #3   Title  The patient will have grossly 4+/5 lumbo/pelvic/hip core strength needed for medium lifting and yard work    Time  8    Period  Weeks    Status  On-going      PT LONG TERM GOAL #4   Title  The patient will be able to return to 75% of usual outdoor yard activities with pain level 3/10 or less    Time  8    Period  Weeks    Status   On-going      PT LONG TERM GOAL #5   Title  FOTO functional outcome score improved from 51% to 40%    Time  8    Period  Weeks    Status  Achieved            Plan - 02/06/20 0935    Clinical Impression Statement  Pt doing well with his progression to more yardwork. He successfully push mowed the yard and today will begin the weeding. Pt worked on increasing reps or sets to translate into improving his functional activity tolerance.    Personal Factors and Comorbidities  Age;Comorbidity 1;Comorbidity 2    Comorbidities  PTSD; history of prostate cancer    Examination-Activity Limitations  Carry;Lift;Squat;Stairs;Stand;Locomotion Level    Examination-Participation Restrictions  Community Activity;Yard Work    Stability/Clinical Decision Making  Stable/Uncomplicated    Rehab Potential  Good    PT Frequency  2x / week    PT Duration  8 weeks    PT Treatment/Interventions  ADLs/Self Care Home Management;Electrical Stimulation;Cryotherapy;Moist Heat;Neuromuscular re-education;Therapeutic exercise;Therapeutic activities;Patient/family education;Manual techniques;Dry needling;Taping    PT Next Visit Plan  Lifting; gardening/yard work simulation;  left hip/quad strength    PT Home Exercise Plan  Access Code: [email or web address] and Agree with Plan of Care  Patient       Patient will benefit from skilled therapeutic intervention in order to improve the following deficits and impairments:  Decreased range of motion, Pain, Decreased activity tolerance, Impaired perceived functional ability, Decreased strength, Impaired flexibility  Visit Diagnosis: Acute left-sided low back pain, unspecified whether sciatica present  Muscle weakness (generalized)     Problem List There are no problems to display for this  patient.  [name], PT 02/07/20 6:57 AM Phone: [phone] Fax: [phone] Whitnie Deleon, PTA 02/06/2020, 1:58 PM  [facility] [address], [place], [place], [address] Phone: [phone]   Fax:  [phone]  Name: [name] MRN: [id number] Date of Birth: 1945-11-23

## 2020-02-07 ENCOUNTER — Encounter: Payer: No Typology Code available for payment source | Admitting: Physical Therapy

## 2020-02-12 ENCOUNTER — Ambulatory Visit: Payer: No Typology Code available for payment source | Admitting: Physical Therapy

## 2020-02-12 ENCOUNTER — Encounter: Payer: Self-pay | Admitting: Physical Therapy

## 2020-02-12 ENCOUNTER — Other Ambulatory Visit: Payer: Self-pay

## 2020-02-12 DIAGNOSIS — M545 Low back pain, unspecified: Secondary | ICD-10-CM

## 2020-02-12 DIAGNOSIS — M6281 Muscle weakness (generalized): Secondary | ICD-10-CM
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
## 2020-02-14 ENCOUNTER — Ambulatory Visit: Payer: No Typology Code available for payment source | Admitting: Physical Therapy

## 2020-02-14 ENCOUNTER — Other Ambulatory Visit: Payer: Self-pay

## 2020-02-14 ENCOUNTER — Encounter: Payer: Self-pay | Admitting: Physical Therapy

## 2020-02-14 DIAGNOSIS — M6281 Muscle weakness (generalized): Secondary | ICD-10-CM

## 2020-02-14 DIAGNOSIS — M545 Low back pain, unspecified: Secondary | ICD-10-CM

## 2020-02-14 NOTE — Therapy (Signed)
[facility] [address] [place], [place], [address] Phone: [phone]   Fax:  [phone]  Physical Therapy Treatment  Patient Details  Name: [name] MRN: [id number] Date of Birth: 10/17/1945 Referring Provider (PT): Dr. Saintclair Halsted   Encounter Date: 02/14/2020  PT End of Session - 02/14/20 0951    Visit Number  12    Number of Visits  15    Date for PT Re-Evaluation  03/04/20    Authorization Type  VA approved Eval + 14 visits    ends 06/03/20  10th visit progress note    PT Start Time  0931    PT Stop Time  1012    PT Time Calculation (min)  41 min    Activity Tolerance  Patient tolerated treatment well       Past Medical History:  Diagnosis Date  . Cardiac murmur   . Hyperlipidemia   . Prostate cancer [facility])     Past Surgical History:  Procedure Laterality Date  . HAND SURGERY     4-5 times  . INGUINAL HERNIA REPAIR    . KNEE ARTHROSCOPY     left  . NASAL SEPTUM SURGERY    . PROSTATECTOMY    . TONSILLECTOMY      There were no vitals filed for this visit.  Subjective Assessment - 02/14/20 0933    Subjective  [facility] pinc[facility].   I did my weeding Tuesday without  Back is much better today.  I took it easy yesterday.  I can come down steps reciprocally now.    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  Yes    Pain Score  2     Pain Location  Back    Pain Orientation  Lower    Pain Type  Surgical pain                        [facility] Adult PT Treatment/Exercise - 02/14/20 0001      Lumbar Exercises: Stretches   Other Lumbar Stretch Exercise  nerve floss on/off 10x right/left       Lumbar Exercises: Machines for  Strengthening   Leg Press  seat 7: Bil 85# 15x2;  LT and RT LE 45# 15x    Other Lumbar Machine Exercise  seated rows 25# 2x 10    Other Lumbar Machine Exercise  lat bar 25# 2x 10       Lumbar Exercises: Supine   Large Ball Abdominal Isometric  10 reps;5 seconds    Large Ball Oblique Isometric  10 reps;5 seconds    Other Supine Lumbar Exercises  green band holds at 90 degrees at 4 angles while marching 6x each       Lumbar Exercises: Sidelying   Clam  Right;Left;10 reps      Knee/Hip Exercises: Aerobic   Other Aerobic  UBE 5 min alternating directions               PT Short Term Goals - 01/28/20 1503      PT SHORT TERM GOAL #1   Title  The patient will demonstrate understanding of  initial HEP to promote healing and safe return to function    Time  4    Period  Weeks    Status  Achieved    Target Date  02/05/20        PT Long Term Goals - 02/06/20 1017      PT LONG TERM GOAL #1   Title  The patient will be independent in safe self progression of HEP    Time  8    Period  Weeks    Status  On-going      PT LONG TERM GOAL #2   Title  The patient will be able to walk 2 miles with pain level 2/10 or less    Time  8    Period  Weeks    Status  Achieved      PT LONG TERM GOAL #3   Title  The patient will have grossly 4+/5 lumbo/pelvic/hip core strength needed for medium lifting and yard work    Time  8    Period  Weeks    Status  On-going      PT LONG TERM GOAL #4   Title  The patient will be able to return to 75% of usual outdoor yard activities with pain level 3/10 or less    Time  8    Period  Weeks    Status  On-going      PT LONG TERM GOAL #5   Title  FOTO functional outcome score improved from 51% to 40%    Time  8    Period  Weeks    Status  Achieved            Plan - 02/14/20 1010    Clinical Impression Statement  The patient is able to perform supine, sidelying and seated positions.  He reports some discomfort with seated rows but this  quickly dissipates upon completion of the ex.  Good postural awareness overall.  He had a positive response to KT taping from last visit and this is still in place.  He continues to do some yardwork including weeding in moderation.  Verbal and tactile cues with ex's for optimal glute or transverse abdominus muscle activation.  Therapist monitoring response with all ex and modifying based on response.    Comorbidities  PTSD; history of prostate cancer    Examination-Activity Limitations  Carry;Lift;Squat;Stairs;Stand;Locomotion Level    Rehab Potential  Good    PT Frequency  2x / week    PT Duration  8 weeks    PT Treatment/Interventions  ADLs/Self Care Home Management;Electrical Stimulation;Cryotherapy;Moist Heat;Neuromuscular re-education;Therapeutic exercise;Therapeutic activities;Patient/family education;Manual techniques;Dry needling;Taping    PT Next Visit Plan  having finger surgery in 1 week;  may need to hold on last visit until he has recovered from this surgery;  Lifting; gardening/yard work simulation;  left hip/quad strength;  KT, manual therapy as needed    PT Home Exercise Plan  Access Code: [facility]       Patient will benefit from skilled therapeutic intervention in order to improve the following deficits and impairments:  Decreased range of motion, Pain, Decreased activity tolerance, Impaired perceived functional ability, Decreased strength, Impaired flexibility  Visit Diagnosis: Acute left-sided low back pain, unspecified whether sciatica present  Muscle weakness (generalized)     Problem List There are no problems to display for this patient.  [name], PT 02/14/20 12:40 PM Phone: [phone] Fax: [phone] [name] 02/14/2020, 12:40 PM  [facility] [address], [place], [place], [address] Phone: [phone]   Fax:  [phone]  Name: [name] MRN: [id number] Date of Birth:  1946-06-02

## 2020-02-19 ENCOUNTER — Ambulatory Visit: Payer: No Typology Code available for payment source | Admitting: Physical Therapy

## 2020-02-19 ENCOUNTER — Other Ambulatory Visit: Payer: Self-pay

## 2020-02-19 ENCOUNTER — Encounter: Payer: Self-pay | Admitting: Physical Therapy

## 2020-02-19 DIAGNOSIS — M545 Low back pain, unspecified: Secondary | ICD-10-CM

## 2020-02-19 DIAGNOSIS — M6281 Muscle weakness (generalized): Secondary | ICD-10-CM

## 2020-02-19 NOTE — Therapy (Signed)
[facility] [address] [place], [place], [address] Phone: [phone]   Fax:  [phone]  Physical Therapy Treatment  Patient Details  Name: [name] MRN: [id number] Date of Birth: 1945/10/18 Referring Provider (PT): Dr. Saintclair Halsted   Encounter Date: 02/19/2020  PT End of Session - 02/19/20 0952    Visit Number  13    Number of Visits  15    Date for PT Re-Evaluation  03/04/20    Authorization Type  [facility] approved Eval + 14 visits    ends 06/03/20  10th visit progress note    PT Start Time  0933    PT Stop Time  1011    PT Time Calculation (min)  38 min    Activity Tolerance  Patient tolerated treatment well       Past Medical History:  Diagnosis Date  . Cardiac murmur   . Hyperlipidemia   . Prostate cancer [facility])     Past Surgical History:  Procedure Laterality Date  . HAND SURGERY     4-5 times  . INGUINAL HERNIA REPAIR    . KNEE ARTHROSCOPY     left  . NASAL SEPTUM SURGERY    . PROSTATECTOMY    . TONSILLECTOMY      There were no vitals filed for this visit.  Subjective Assessment - 02/19/20 0934    Subjective  Weekend was good.  Back feels good.  Worked out in the yard yesterday without discomfort.  Finger surgery this Friday.    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  Yes    Pain Location  Back    Pain Orientation  Lower    Pain Type  Surgical pain                        [facility] Adult PT Treatment/Exercise - 02/19/20 0001      Lumbar Exercises: Aerobic   UBE (Upper Arm Bike)  5 min forward/backward      Lumbar Exercises: Machines for Strengthening   Leg Press  seat 7: Bil 90# 10x2;  LT and RT LE 45# 15x    Other Lumbar Machine Exercise  seated rows 25# 2x 10    Other  Lumbar Machine Exercise  lat bar 30# 2x 10       Lumbar Exercises: Standing   Row Limitations  green band punches forward 10x alternating     Shoulder Extension Limitations  green band overhead pull downs 20x     Other Standing Lumbar Exercises  holding 5# wts in each hand with 10x mini forward lunges     Other Standing Lumbar Exercises  5# overhead weight press alternating UEs      Lumbar Exercises: Supine   Large Ball Oblique Isometric Limitations  roll red ball up thighs 10x with head lift     Other Supine Lumbar Exercises  UE/LE alternating from red ball 10x     Other Supine Lumbar Exercises  red ball overhead 10x       Lumbar Exercises: Sidelying   Hip Abduction  Right;Left;10 reps               PT Short Term Goals -  01/28/20 1503      PT SHORT TERM GOAL #1   Title  The patient will demonstrate understanding of initial HEP to promote healing and safe return to function    Time  4    Period  Weeks    Status  Achieved    Target Date  02/05/20        PT Long Term Goals - 02/06/20 1017      PT LONG TERM GOAL #1   Title  The patient will be independent in safe self progression of HEP    Time  8    Period  Weeks    Status  On-going      PT LONG TERM GOAL #2   Title  The patient will be able to walk 2 miles with pain level 2/10 or less    Time  8    Period  Weeks    Status  Achieved      PT LONG TERM GOAL #3   Title  The patient will have grossly 4+/5 lumbo/pelvic/hip core strength needed for medium lifting and yard work    Time  8    Period  Weeks    Status  On-going      PT LONG TERM GOAL #4   Title  The patient will be able to return to 75% of usual outdoor yard activities with pain level 3/10 or less    Time  8    Period  Weeks    Status  On-going      PT LONG TERM GOAL #5   Title  FOTO functional outcome score improved from 51% to 40%    Time  8    Period  Weeks    Status  Achieved            Plan - 02/19/20 0953    Clinical Impression  Statement  The patient is able to perform increased intensity lumbo/pelvic/core strengthening ex's with minimal to no complaint of discomfort.    He reports muscular fatigue of an appropriate level for the exercise.  Good postural awareness with seated exercise.  He is on track to meet rehab goals.  Anticipate readiness for discharge in approx 2 visits.  Therapist monitoring response with all treatment interventions.    Personal Factors and Comorbidities  Age;Comorbidity 1;Comorbidity 2    Comorbidities  PTSD; history of prostate cancer    Examination-Activity Limitations  Carry;Lift;Squat;Stairs;Stand;Locomotion Level    Rehab Potential  Good    PT Frequency  2x / week    PT Duration  8 weeks    PT Treatment/Interventions  ADLs/Self Care Home Management;Electrical Stimulation;Cryotherapy;Moist Heat;Neuromuscular re-education;Therapeutic exercise;Therapeutic activities;Patient/family education;Manual techniques;Dry needling;Taping    PT Next Visit Plan  having finger surgery in 1 week;  may need to hold on last visit until he has recovered from this surgery;  Lifting; gardening/yard work simulation;  left hip/quad strength;  KT, manual therapy as needed    PT Home Exercise Plan  Access Code: [facility]       Patient will benefit from skilled therapeutic intervention in order to improve the following deficits and impairments:  Decreased range of motion, Pain, Decreased activity tolerance, Impaired perceived functional ability, Decreased strength, Impaired flexibility  Visit Diagnosis: Acute left-sided low back pain, unspecified whether sciatica present  Muscle weakness (generalized)     Problem List There are no problems to display for this patient.  [name], PT 02/19/20 1:07 PM Phone: [phone] Fax: [phone] [name]  02/19/2020, 1:06 PM  [facility] [address] [place], [place], [address] Phone:  [phone]   Fax:  [phone]  Name: [name] MRN: [id number] Date of Birth: 11-12-1945

## 2020-02-21 ENCOUNTER — Ambulatory Visit: Payer: No Typology Code available for payment source | Admitting: Physical Therapy

## 2020-02-21 ENCOUNTER — Other Ambulatory Visit: Payer: Self-pay

## 2020-02-21 DIAGNOSIS — M545 Low back pain, unspecified: Secondary | ICD-10-CM

## 2020-02-21 DIAGNOSIS — M6281 Muscle weakness (generalized): Secondary | ICD-10-CM

## 2020-02-21 NOTE — Therapy (Signed)
[facility] [address] [place], [place], [address] Phone: [phone]   Fax:  [phone]  Physical Therapy Treatment  Patient Details  Name: [name] MRN: [id number] Date of Birth: 1946-06-16 Referring Provider (PT): Dr. Saintclair Halsted   Encounter Date: 02/21/2020  PT End of Session - 02/21/20 0954    Visit Number  14    Number of Visits  15    Date for PT Re-Evaluation  03/04/20    Authorization Type  VA approved Eval + 14 visits    ends 06/03/20  10th visit progress note    PT Start Time  0930    PT Stop Time  1010    PT Time Calculation (min)  40 min    Activity Tolerance  Patient tolerated treatment well       Past Medical History:  Diagnosis Date  . Cardiac murmur   . Hyperlipidemia   . Prostate cancer [facility])     Past Surgical History:  Procedure Laterality Date  . HAND SURGERY     4-5 times  . INGUINAL HERNIA REPAIR    . KNEE ARTHROSCOPY     left  . NASAL SEPTUM SURGERY    . PROSTATECTOMY    . TONSILLECTOMY      There were no vitals filed for this visit.  Subjective Assessment - 02/21/20 0931    Subjective  I have 2 questions:  how much exercise should I do at home on top of yard work?  Denies soreness from last session.  Can you do KT one more time.  It was a pleasant surprise on how much that pincher/gripper helps with yard work.    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  Yes    Pain Score  2     Pain Location  Back         [facility] PT Assessment - 02/21/20 0001      Observation/Other Assessments   Focus on Therapeutic Outcomes (FOTO)   24%       AROM   Lumbar Flexion  50    Lumbar Extension  22    Lumbar - Right Side Bend  20    Lumbar -  Left Side Bend  20      Strength   Right Hip ABduction  5/5    Left Hip Flexion  5/5    Left Hip ABduction  5/5    Lumbar Flexion  4+/5    Lumbar Extension  5/5      Flexibility   Soft Tissue Assessment /Muscle Length  yes    Hamstrings  70 degree bil                    [facility] Adult PT Treatment/Exercise - 02/21/20 0001      Lumbar Exercises: Stretches   Active Hamstring Stretch  Right;Left;3 reps;30 seconds    Active Hamstring Stretch Limitations  supine      Lumbar Exercises: Aerobic   UBE (Upper Arm Bike)  5 min forward/backward      Lumbar Exercises: Machines for Strengthening   Leg Press  seat 7: Bil 90# 10x2;  LT and RT LE  45# 15x    Other Lumbar Machine Exercise  seated rows 25# 2x 10    Other Lumbar Machine Exercise  lat bar 30# 2x 10       Kines[employer]ix   Facilitate Muscle   Star pattern for support                PT Short Term Goals - 02/21/20 0953      PT SHORT TERM GOAL #1   Title  The patient will demonstrate understanding of initial HEP to promote healing and safe return to function    Status  Achieved      PT SHORT TERM GOAL #2   Title  The patient will demonstrate proper hip hinge technique with sit to stand and picking up objects from the floor/ground < 10#    Status  Achieved      PT SHORT TERM GOAL #3   Title  Improved lumbar ROM with flexion to 40 degrees, extension to 10 degrees and sidebending to 25 degrees needed for return to yard work    Status  Partially Met        PT Long Term Goals - 02/21/20 0953      PT LONG TERM GOAL #1   Title  The patient will be independent in safe self progression of HEP    Time  8    Period  Weeks    Status  Partially Met      PT LONG TERM GOAL #2   Title  The patient will be able to walk 2 miles with pain level 2/10 or less    Status  Achieved      PT LONG TERM GOAL #3   Title  The patient will have grossly 4+/5 lumbo/pelvic/hip core strength needed for medium lifting and yard work     Status  Achieved      PT LONG TERM GOAL #4   Title  The patient will be able to return to 75% of usual outdoor yard activities with pain level 3/10 or less    Baseline  80% 02/21/20    Status  Achieved      PT LONG TERM GOAL #5   Title  FOTO functional outcome score improved from 51% to 40%    Status  Achieved            Plan - 02/21/20 1336    Clinical Impression Statement  The patient has returned to 80% of his usual yard work chores.  His LE and lumbo/pelvic strength is grossly 4+/5 to 5/5.  HIs FOTO score has improved significantly to 24%.   Minimal cues needed for hip hinge method with sit to stand and bending forward.  Therapist monitoring response with all interventions.   He has finger surgery tomorrow and is unsure whether he will be up to attend his last scheduled PT appt next week.    Rehab Potential  Good    PT Frequency  2x / week    PT Duration  8 weeks    PT Treatment/Interventions  ADLs/Self Care Home Management;Electrical Stimulation;Cryotherapy;Moist Heat;Neuromuscular re-education;Therapeutic exercise;Therapeutic activities;Patient/family education;Manual techniques;Dry needling;Taping    PT Next Visit Plan  possible discharge next week    PT Home Exercise Plan  Access Code: [facility]       Patient will benefit from skilled therapeutic intervention in order to improve the following deficits and impairments:  Decreased range of motion, Pain, Decreased activity tolerance, Impaired perceived functional ability, Decreased strength, Impaired flexibility  Visit Diagnosis: Acute left-sided low back pain, unspecified whether sciatica present  Muscle weakness (generalized)     Problem List There are no problems to display for this patient.  [name], PT 02/21/20 1:43 PM Phone: [phone] Fax: [phone] [name] 02/21/2020, 1:42 PM  [facility] [address] [place], [place],  [address] Phone: [phone]   Fax:  [phone]  Name: [name] MRN: [id number] Date of Birth: 1946/09/18

## 2020-02-26 ENCOUNTER — Other Ambulatory Visit: Payer: Self-pay

## 2020-02-26 ENCOUNTER — Ambulatory Visit: Payer: No Typology Code available for payment source | Admitting: Physical Therapy

## 2020-02-26 DIAGNOSIS — M6281 Muscle weakness (generalized): Secondary | ICD-10-CM

## 2020-02-26 DIAGNOSIS — M545 Low back pain, unspecified: Secondary | ICD-10-CM

## 2020-02-26 NOTE — Therapy (Signed)
[facility] [address] [place], [place], [address] Phone: [phone]   Fax:  [phone]  Physical Therapy Treatment/Discharge Summary   Patient Details  Name: [name] MRN: [id number] Date of Birth: 30-Jul-1946 Referring Provider (PT): Dr. Saintclair Halsted   Encounter Date: 02/26/2020  PT End of Session - 02/26/20 0935    Visit Number  15    Number of Visits  15    Date for PT Re-Evaluation  03/04/20    Authorization Type  VA approved Eval + 14 visits    ends 06/03/20  10th visit progress note    PT Start Time  0928    PT Stop Time  1010    PT Time Calculation (min)  42 min    Activity Tolerance  Patient tolerated treatment well       Past Medical History:  Diagnosis Date  . Cardiac murmur   . Hyperlipidemia   . Prostate cancer [facility])     Past Surgical History:  Procedure Laterality Date  . HAND SURGERY     4-5 times  . INGUINAL HERNIA REPAIR    . KNEE ARTHROSCOPY     left  . NASAL SEPTUM SURGERY    . PROSTATECTOMY    . TONSILLECTOMY      There were no vitals filed for this visit.  Subjective Assessment - 02/26/20 0928    Subjective  Had finger surgery on Friday.  My back is still doing well.    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  No/denies    Pain Score  0-No pain    Pain Location  Back         [facility] PT Assessment - 02/26/20 0001      Observation/Other Assessments   Focus on Therapeutic Outcomes (FOTO)   24%       AROM   Lumbar Flexion  50    Lumbar Extension  22    Lumbar - Right Side Bend  20    Lumbar - Left Side Bend  20      Strength   Right Hip ABduction  5/5    Left Hip Flexion  5/5     Left Hip ABduction  5/5    Lumbar Flexion  5/5    Lumbar Extension  5/5                    [facility] Adult PT Treatment/Exercise - 02/26/20 0001      Therapeutic Activites    Lifting  review of hip hinge with sit to stand; use with ADLs, walking, standing, yard work       Lumbar Exercises: Stretches   Other Lumbar Stretch Exercise  2nd step hip flexor stretch 10x right/left     Other Lumbar Stretch Exercise  --      Lumbar Exercises: Aerobic   Nustep  L2 5 min       Lumbar Exercises: Machines for Strengthening   Leg Press  seat 7: Bil 90# 10x2;  LT and RT LE 45# 15x    Other Lumbar Machine Exercise  seated rows 25# 1x  10    Other Lumbar Machine Exercise  lat bar 25# 1 x 10       Lumbar Exercises: Standing   Other Standing Lumbar Exercises  alternating rows and shoulder extensions 15x     Other Standing Lumbar Exercises  review of Pallof press and diagonals       Lumbar Exercises: Supine   Other Supine Lumbar Exercises  small ball abdominals    Other Supine Lumbar Exercises  dead bug 8x       Lumbar Exercises: Sidelying   Clam Limitations  reverse clam 10x right/left       Knee/Hip Exercises: Seated   Sit to Sand  10 reps               PT Short Term Goals - 02/26/20 1741      PT SHORT TERM GOAL #1   Title  The patient will demonstrate understanding of initial HEP to promote healing and safe return to function    Status  Achieved      PT SHORT TERM GOAL #2   Title  The patient will demonstrate proper hip hinge technique with sit to stand and picking up objects from the floor/ground < 10#    Status  Achieved      PT SHORT TERM GOAL #3   Title  Improved lumbar ROM with flexion to 40 degrees, extension to 10 degrees and sidebending to 25 degrees needed for return to yard work    Status  Achieved        PT Long Term Goals - 02/26/20 1004      PT LONG TERM GOAL #1   Title  The patient will be independent in safe self progression of HEP    Status   Achieved      PT LONG TERM GOAL #2   Title  The patient will be able to walk 2 miles with pain level 2/10 or less    Status  Achieved      PT LONG TERM GOAL #3   Title  The patient will have grossly 4+/5 lumbo/pelvic/hip core strength needed for medium lifting and yard work    Status  Achieved      PT LONG TERM GOAL #4   Title  The patient will be able to return to 75% of usual outdoor yard activities with pain level 3/10 or less    Status  Achieved      PT LONG TERM GOAL #5   Title  FOTO functional outcome score improved from 51% to 40%    Status  Achieved            Plan - 02/26/20 1243    Clinical Impression Statement  The patient has progressed very well with PT including mobility exs and strengthening ex's.  He has returned to 80% of his previous yard work activities including mowing which is about 4 miles of walking.  He demonstrates a good understanding of proper lifting and body mechanics with ADLs.  He is independent in a safe self progression of a HEP.  Recommend discharge from PT at this time with all rehab goals met.         PHYSICAL THERAPY DISCHARGE SUMMARY  Visits from Start of Care: 15  Current functional level related to goals / functional outcomes: See clinical impressions above   Remaining deficits: As above   Education / Equipment: HEP Plan: Patient agrees to discharge.  Patient goals were met. Patient is being discharged due  to meeting the stated rehab goals.  ?????     Patient will benefit from skilled therapeutic intervention in order to improve the following deficits and impairments:     Visit Diagnosis: Acute left-sided low back pain, unspecified whether sciatica present  Muscle weakness (generalized)     Problem List There are no problems to display for this patient. [name], PT 02/26/20 5:43 PM Phone: [phone] Fax: [phone] [name] 02/26/2020, 5:42 PM  [facility] [address] [place], [place], [address] Phone: [phone]   Fax:  [phone]  Name: [name] MRN: [id number] Date of Birth: December 17, 1945

## 2020-08-10 IMAGING — MR MR CERVICAL SPINE W/O CM
4 of 5 series · 32 of 48 positions shown · non-contrast
Comparison: None.

CLINICAL DATA: Progressive low back pain and bilateral lower
extremity weakness, left greater than right.

EXAM:
MRI CERVICAL AND LUMBAR SPINE WITHOUT CONTRAST
TECHNIQUE: Multiplanar and multiecho pulse sequences of the cervical spine, to
include the craniocervical junction and cervicothoracic junction,
and lumbar spine, were obtained without intravenous contrast.

[Series 5: T1 · sagittal · 3.0mm · 0.69mm/px · 8 of 17 slices shown (1 of 2)]
[im 1/17]
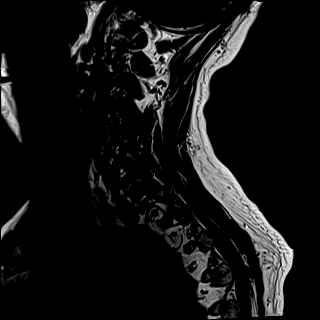
[im 3/17]
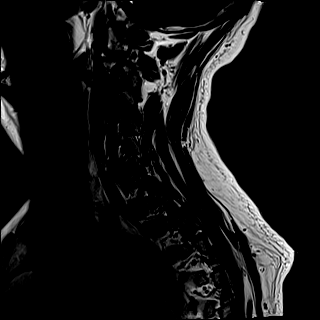
[im 5/17]
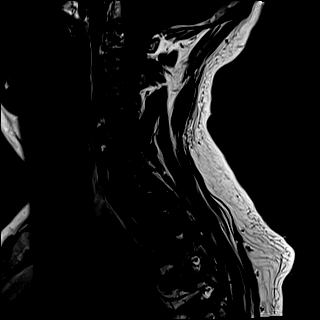
[im 7/17]
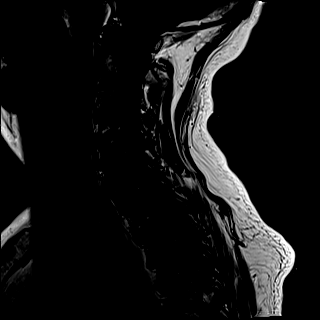
[im 10/17]
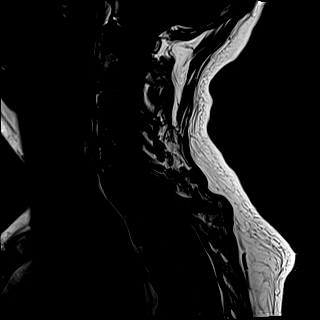
[im 12/17]
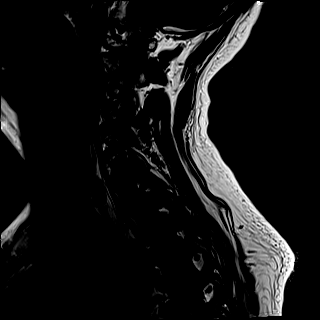
[im 14/17]
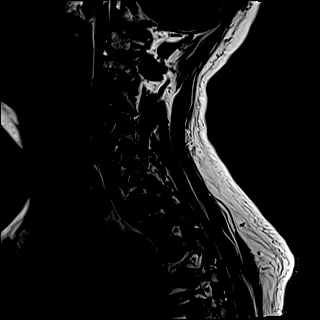
[im 17/17]
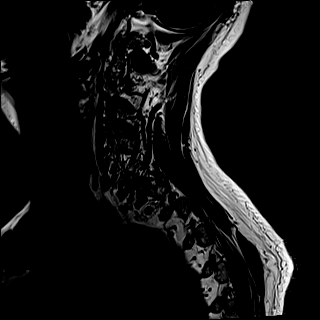

[Series 6: T2 · sagittal · 3.0mm · 0.69mm/px · 8 of 17 slices shown (1 of 2)]
[im 1/17]
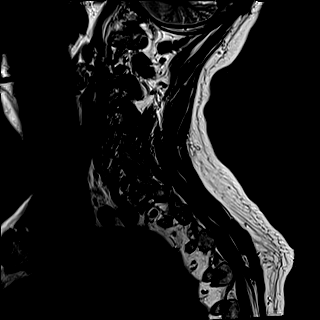
[im 3/17]
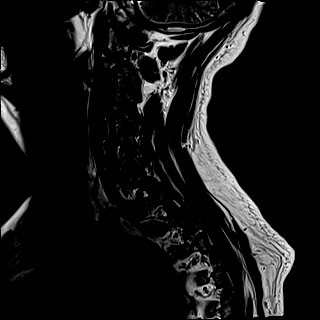
[im 5/17]
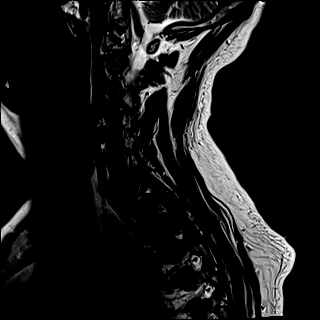
[im 7/17]
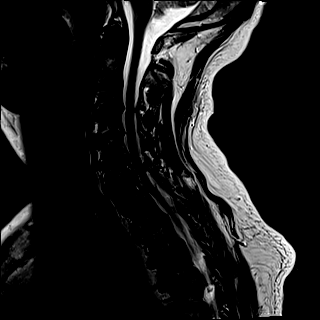
[im 10/17]
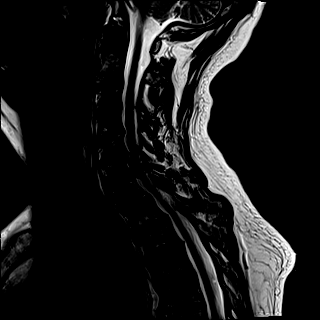
[im 12/17]
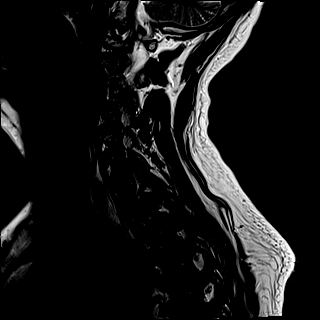
[im 14/17]
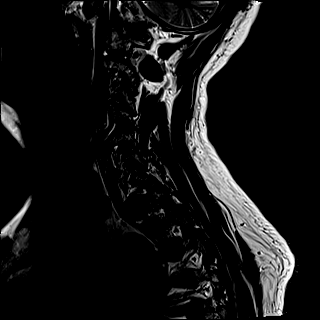
[im 17/17]
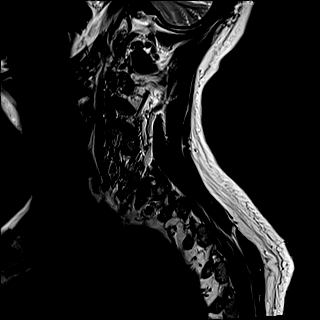

[Series 8: T2 · axial · 4.0mm · 0.70mm/px · z∈[-44,+62]mm · 9 of 27 slices shown (2 of 2)]
[im 1/27]
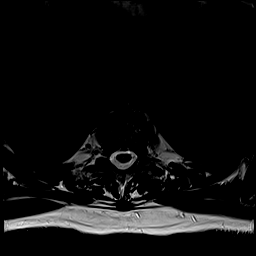
[im 5/27]
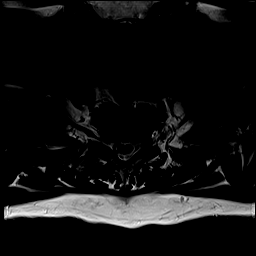
[im 8/27]
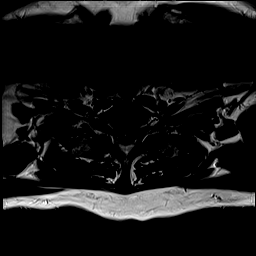
[im 12/27]
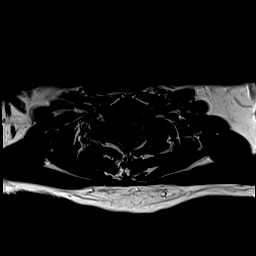
[im 15/27]
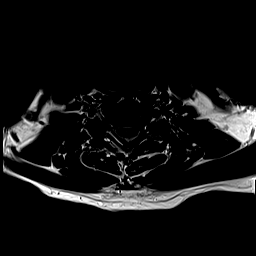
[im 19/27]
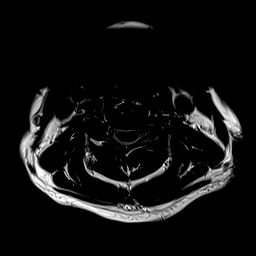
[im 22/27]
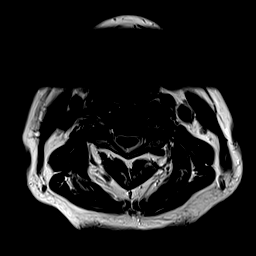
[im 24/27]
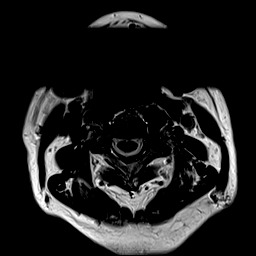
[im 27/27]
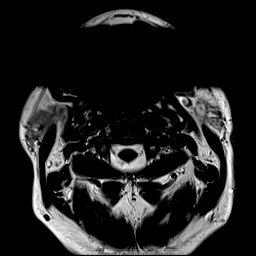

[Series 9: T1 · axial · 4.0mm · 0.35mm/px · z∈[-44,+50]mm · 7 of 27 slices shown (2 of 2)]
[im 1/27]
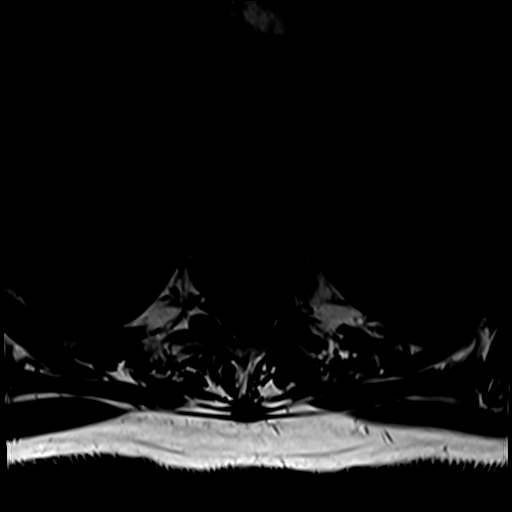
[im 5/27]
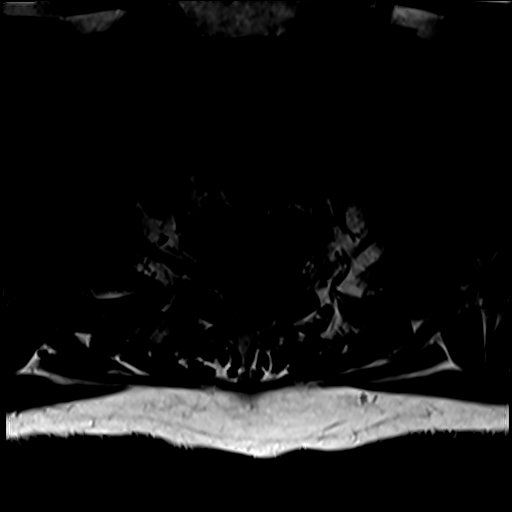
[im 8/27]
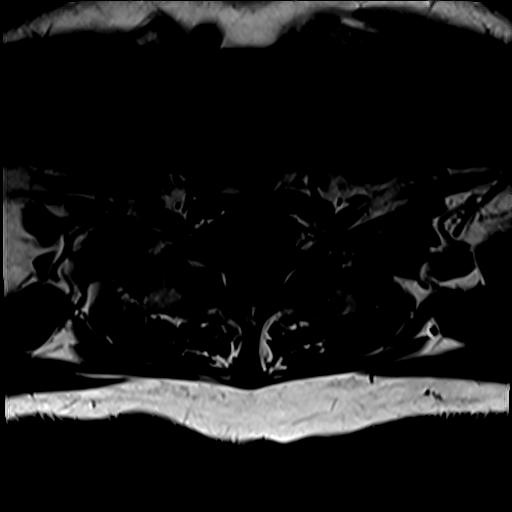
[im 12/27]
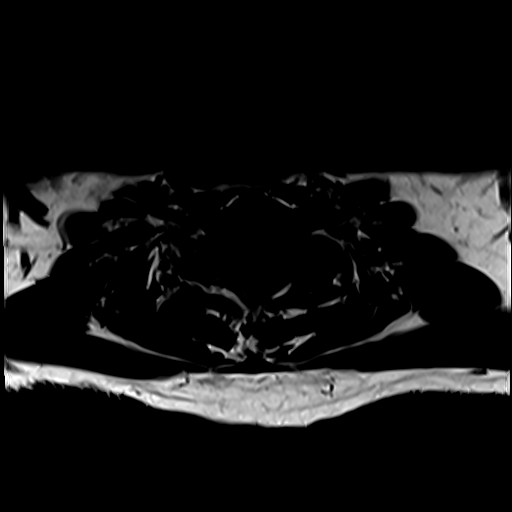
[im 15/27]
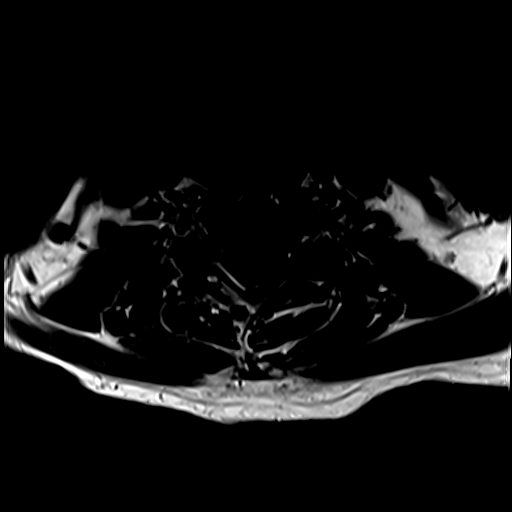
[im 19/27]
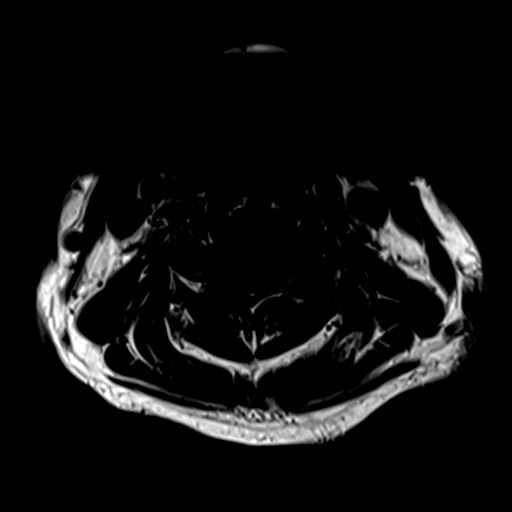
[im 24/27]
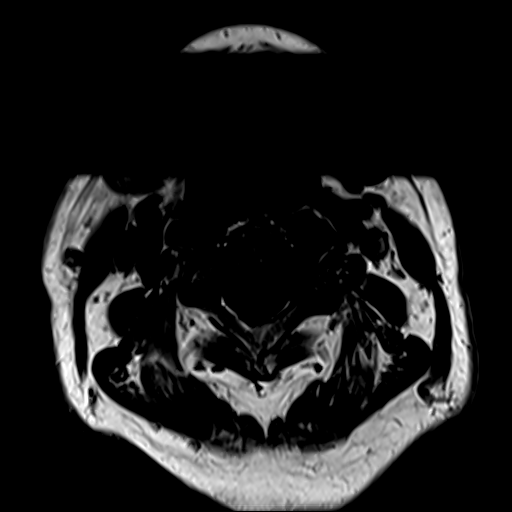

[32 of 48 positions shown; findings below may reference images not displayed]

FINDINGS: MRI CERVICAL SPINE FINDINGS

Alignment: 2.5 mm spondylolisthesis of C7 on T1. Alignment is
otherwise normal.

Vertebrae: No fracture, evidence of discitis, or bone lesion.

Cord: Normal signal and morphology.

Posterior Fossa, vertebral arteries, paraspinal tissues: Negative.

Disc levels:

C2-3: Normal disc. Widely patent neural foramina. Minimal
degenerative changes of the facet joints.

C3-4: Small broad-based disc osteophyte complex asymmetric to the
left with slight left foraminal stenosis. Mild right and moderate
left facet arthritis.

C4-5: Disc space narrowing degenerative changes of the vertebral
endplates. Minimal broad-based disc osteophyte complex without
neural impingement. Widely patent neural foramina. No facet
arthritis.

C5-6: Disc space narrowing. Small uncinate spurs to the right and
left without foraminal stenosis. No facet arthritis.

C6-7: Disc space narrowing. Small broad-based disc osteophyte
complex slightly narrowing the AP dimension of the spinal canal
without spinal cord compression. Slight narrowing of the neural
foramina.

C7-T1: 2.5 mm spondylolisthesis. No bulging or protrusion of the
uncovered disc. Small amount of fluid in the disc space. Slight
bilateral facet arthritis.

T1-2 and T2-3: Degenerative disc disease without neural impingement.

MRI LUMBAR SPINE FINDINGS

Segmentation:  5 typical lumbar segments.

Alignment:  3 mm spondylolisthesis at L4-5. Otherwise normal.

Vertebrae:  No fracture, evidence of discitis, or bone lesion.

Conus medullaris and cauda equina: Conus extends to the L2 level.
Conus and cauda equina appear normal.

Paraspinal and other soft tissues: No significant abnormality.
Simple appearing 7.2 cm cyst on the mid left kidney.

Disc levels:

T12-L1: Minimal disc desiccation. Tiny Schmorl's node in the
superior endplate of L1. Tiny central disc bulge with no neural
impingement.

L1-2: Tiny disc bulge asymmetric to the left with no neural
impingement. Otherwise normal.

L2-3: Disc desiccation with slight degenerative changes of the
vertebral endplates. Small broad-based disc bulge with a small
protrusion into the inferior aspect of the left neural foramen
without neural impingement. Minimal degenerative changes of the
facet joints. Otherwise normal.

L3-4: Broad-based disc bulge with a disc protrusion central and to
the left with a small extrusion into the left neural foramen. This
extrusion is adjacent to but not compressing the exiting left L2
nerve. Central annular fissure. Asymmetric compression of the thecal
sac to the left of midline creating mild spinal stenosis.

L4-5: 3 mm spondylolisthesis due to severe bilateral facet
arthritis, left worse than right with some edema in the soft tissues
and bones at the left facet joint. Slight narrowing of the neural
foramina without visible neural impingement. No bulging or
protrusion of the uncovered disc. Hypertrophy of the ligamentum
flavum slightly narrow the spinal canal without significant spinal
stenosis.

L5-S1: Disc space narrowing. No significant disc bulging or
protrusion. Marked thinning of the pars bilaterally without
full-thickness defects. Hypertrophy of the right ligamentum flavum
without neural impingement.
IMPRESSION: 1. Mild degenerative disc disease in the cervical spine without
focal neural impingement.
2. Severe bilateral facet arthritis at L4-5 with a grade 1
spondylolisthesis without neural impingement.
3. Mild spinal stenosis at L3-4 with a small disc extrusion central
and to the left without neural impingement.

## 2020-08-10 IMAGING — MR MR LUMBAR SPINE W/O CM
4 of 5 series · 29 of 48 positions shown · non-contrast
Comparison: None.

CLINICAL DATA: Progressive low back pain and bilateral lower
extremity weakness, left greater than right.

EXAM:
MRI CERVICAL AND LUMBAR SPINE WITHOUT CONTRAST
TECHNIQUE: Multiplanar and multiecho pulse sequences of the cervical spine, to
include the craniocervical junction and cervicothoracic junction,
and lumbar spine, were obtained without intravenous contrast.

[Series 5: T1 · sagittal · 4.0mm · 0.81mm/px · 6 of 17 slices shown (1 of 2)]
[im 1/17]
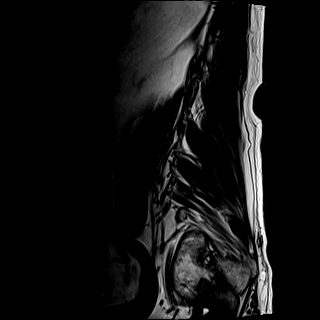
[im 4/17]
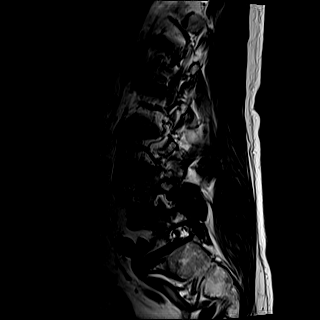
[im 7/17]
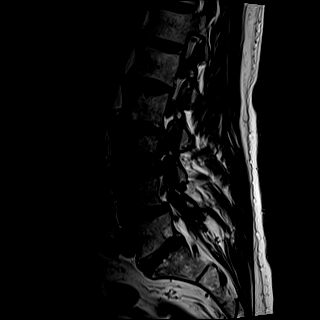
[im 10/17]
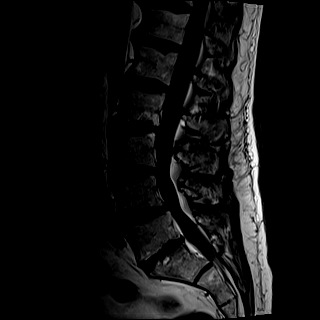
[im 13/17]
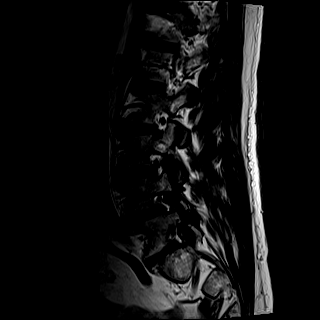
[im 17/17]
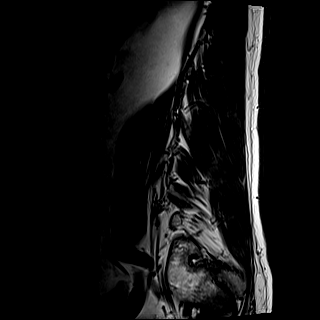

[Series 6: T2 · sagittal · 4.0mm · 0.81mm/px · 6 of 17 slices shown (1 of 2)]
[im 1/17]
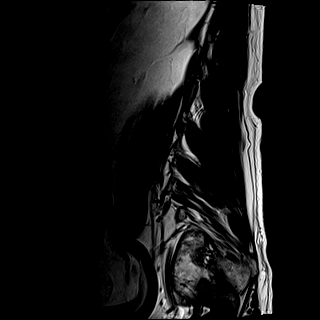
[im 4/17]
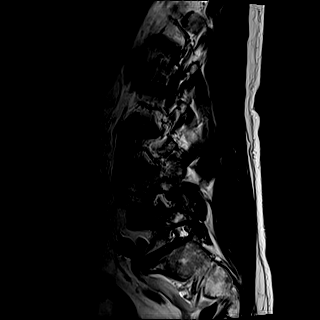
[im 7/17]
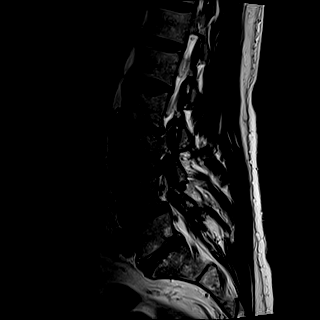
[im 10/17]
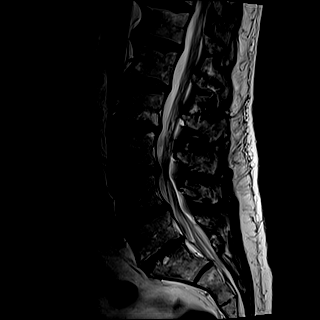
[im 13/17]
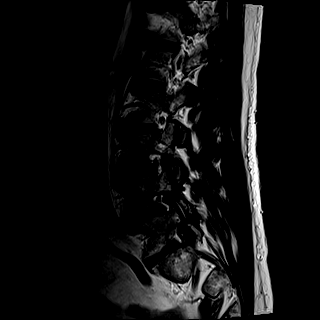
[im 17/17]
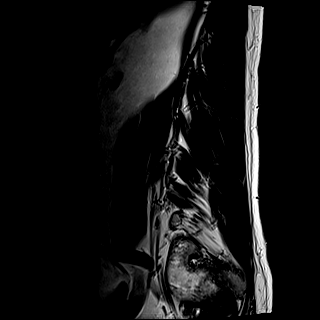

[Series 8: T2 · axial · 4.0mm · 0.62mm/px · z∈[-78,+151]mm · 9 of 43 slices shown (2 of 2)]
[im 1/43]
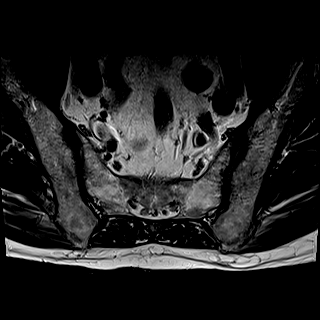
[im 7/43]
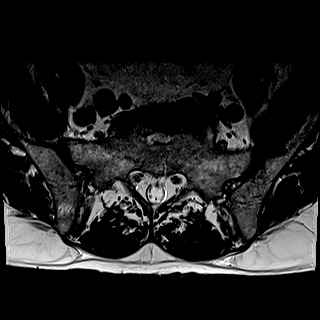
[im 13/43]
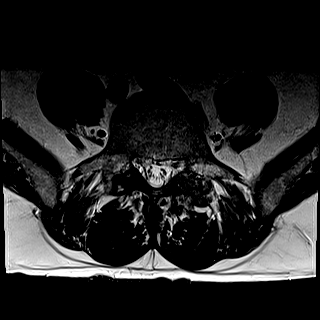
[im 19/43]
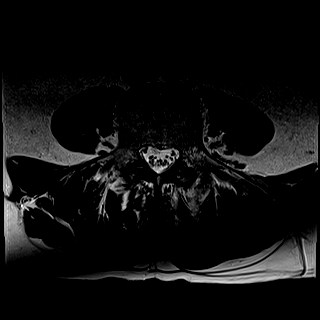
[im 22/43]
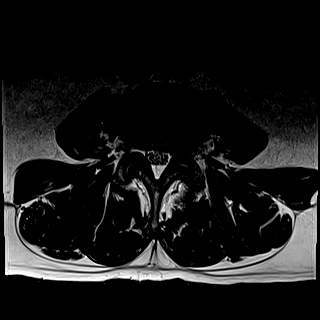
[im 25/43]
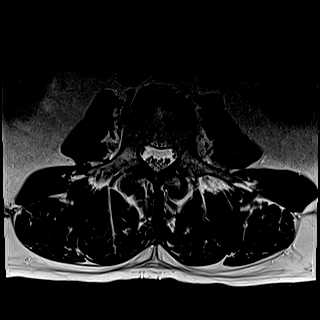
[im 31/43]
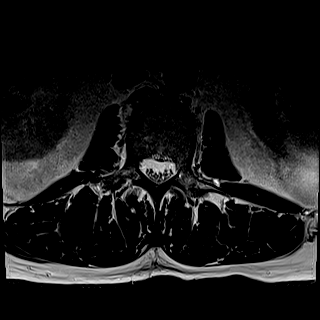
[im 37/43]
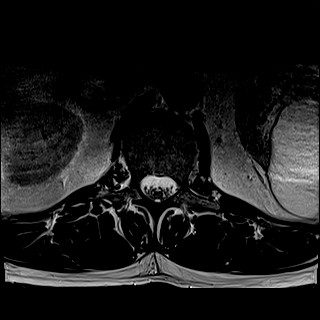
[im 43/43]
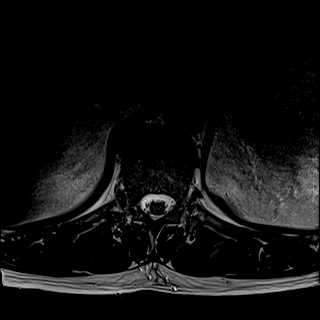

[Series 9: T1 · axial · 4.0mm · 0.43mm/px · z∈[-81,+121]mm · 8 of 43 slices shown (2 of 2)]
[im 1/43]
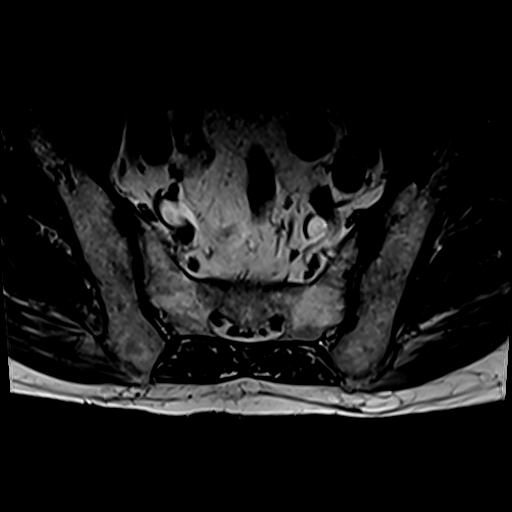
[im 7/43]
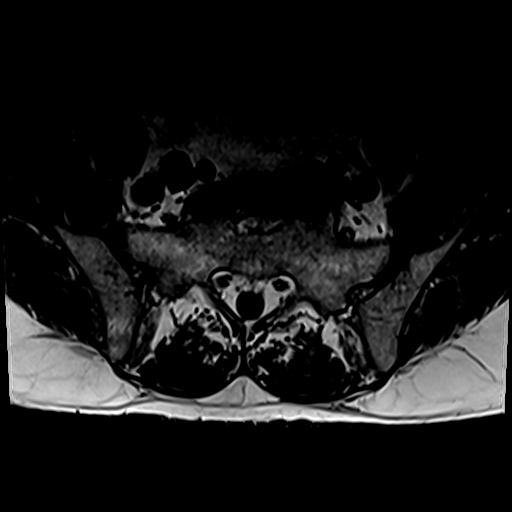
[im 13/43]
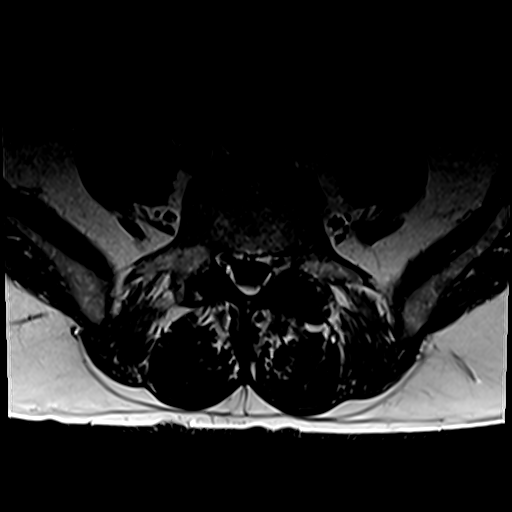
[im 19/43]
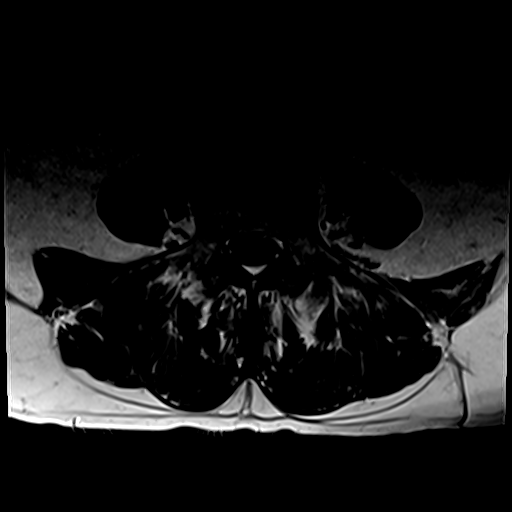
[im 22/43]
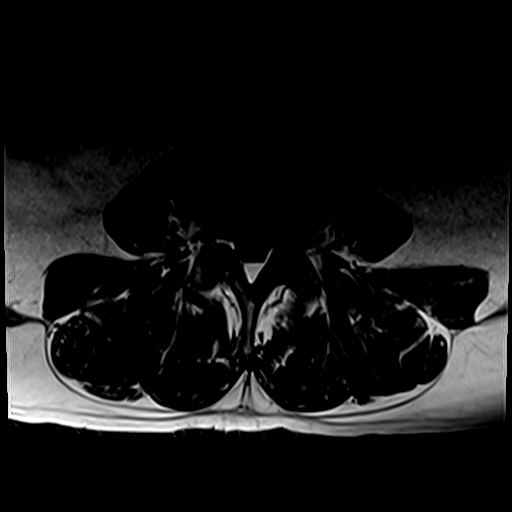
[im 25/43]
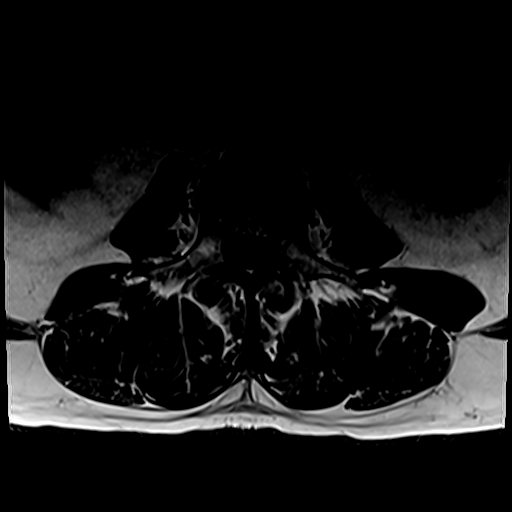
[im 31/43]
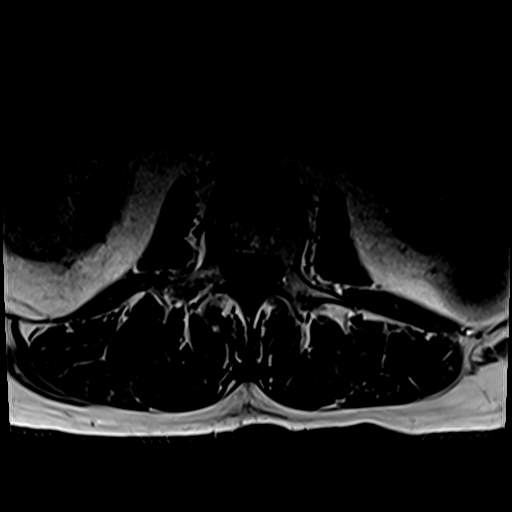
[im 37/43]
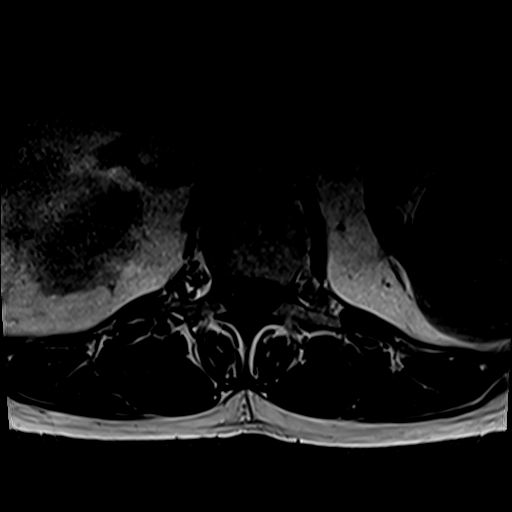

[29 of 48 positions shown; findings below may reference images not displayed]

FINDINGS: MRI CERVICAL SPINE FINDINGS

Alignment: 2.5 mm spondylolisthesis of C7 on T1. Alignment is
otherwise normal.

Vertebrae: No fracture, evidence of discitis, or bone lesion.

Cord: Normal signal and morphology.

Posterior Fossa, vertebral arteries, paraspinal tissues: Negative.

Disc levels:

C2-3: Normal disc. Widely patent neural foramina. Minimal
degenerative changes of the facet joints.

C3-4: Small broad-based disc osteophyte complex asymmetric to the
left with slight left foraminal stenosis. Mild right and moderate
left facet arthritis.

C4-5: Disc space narrowing degenerative changes of the vertebral
endplates. Minimal broad-based disc osteophyte complex without
neural impingement. Widely patent neural foramina. No facet
arthritis.

C5-6: Disc space narrowing. Small uncinate spurs to the right and
left without foraminal stenosis. No facet arthritis.

C6-7: Disc space narrowing. Small broad-based disc osteophyte
complex slightly narrowing the AP dimension of the spinal canal
without spinal cord compression. Slight narrowing of the neural
foramina.

C7-T1: 2.5 mm spondylolisthesis. No bulging or protrusion of the
uncovered disc. Small amount of fluid in the disc space. Slight
bilateral facet arthritis.

T1-2 and T2-3: Degenerative disc disease without neural impingement.

MRI LUMBAR SPINE FINDINGS

Segmentation:  5 typical lumbar segments.

Alignment:  3 mm spondylolisthesis at L4-5. Otherwise normal.

Vertebrae:  No fracture, evidence of discitis, or bone lesion.

Conus medullaris and cauda equina: Conus extends to the L2 level.
Conus and cauda equina appear normal.

Paraspinal and other soft tissues: No significant abnormality.
Simple appearing 7.2 cm cyst on the mid left kidney.

Disc levels:

T12-L1: Minimal disc desiccation. Tiny Schmorl's node in the
superior endplate of L1. Tiny central disc bulge with no neural
impingement.

L1-2: Tiny disc bulge asymmetric to the left with no neural
impingement. Otherwise normal.

L2-3: Disc desiccation with slight degenerative changes of the
vertebral endplates. Small broad-based disc bulge with a small
protrusion into the inferior aspect of the left neural foramen
without neural impingement. Minimal degenerative changes of the
facet joints. Otherwise normal.

L3-4: Broad-based disc bulge with a disc protrusion central and to
the left with a small extrusion into the left neural foramen. This
extrusion is adjacent to but not compressing the exiting left L2
nerve. Central annular fissure. Asymmetric compression of the thecal
sac to the left of midline creating mild spinal stenosis.

L4-5: 3 mm spondylolisthesis due to severe bilateral facet
arthritis, left worse than right with some edema in the soft tissues
and bones at the left facet joint. Slight narrowing of the neural
foramina without visible neural impingement. No bulging or
protrusion of the uncovered disc. Hypertrophy of the ligamentum
flavum slightly narrow the spinal canal without significant spinal
stenosis.

L5-S1: Disc space narrowing. No significant disc bulging or
protrusion. Marked thinning of the pars bilaterally without
full-thickness defects. Hypertrophy of the right ligamentum flavum
without neural impingement.
IMPRESSION: 1. Mild degenerative disc disease in the cervical spine without
focal neural impingement.
2. Severe bilateral facet arthritis at L4-5 with a grade 1
spondylolisthesis without neural impingement.
3. Mild spinal stenosis at L3-4 with a small disc extrusion central
and to the left without neural impingement.

## 2020-12-26 ENCOUNTER — Telehealth: Payer: Self-pay | Admitting: Podiatrist

## 2021-07-29 ENCOUNTER — Encounter: Payer: Self-pay | Admitting: Gastroenterology

## 2021-09-16 ENCOUNTER — Other Ambulatory Visit: Payer: Self-pay

## 2021-09-16 ENCOUNTER — Ambulatory Visit (AMBULATORY_SURGERY_CENTER): Payer: Medicare Other | Admitting: *Deleted

## 2021-09-16 VITALS — Ht 69.0 in | Wt 192.0 lb

## 2021-09-16 DIAGNOSIS — Z86010 Personal history of colonic polyps: Secondary | ICD-10-CM

## 2021-09-16 MED ORDER — NA SULFATE-K SULFATE-MG SULF 17.5-3.13-1.6 GM/177ML PO SOLN: 1.0000 | ORAL | 0 refills | Status: DC

## 2021-09-16 NOTE — Progress Notes (Signed)
Patient's pre-visit was done today over the phone with the patient. Name,DOB and address verified. Patient denies any allergies to Eggs and Soy. Patient denies any problems with anesthesia/sedation. Patient is not taking any diet pills or blood thinners. No home Oxygen. Packet of Prep instructions mailed to patient including a copy of a consent form-pt is aware. Prep instructions sent to pt's MyChart (if activated).Patient understands to call us back with any questions or concerns. Patient is aware of our care-partner policy and [id number] safety protocol. Patient request suprep-pt aware cost. Per patient echo done 25 years ago-none recent. Pt has AAA with yearly checks-last 2 reports given by the pt and placed with pt's chart.  EMMI education assigned to the patient for the procedure, sent to [facility].   The patient is COVID-19 vaccinated.

## 2021-09-30 ENCOUNTER — Encounter: Payer: Self-pay | Admitting: Gastroenterology

## 2021-09-30 ENCOUNTER — Ambulatory Visit (AMBULATORY_SURGERY_CENTER): Payer: Medicare Other | Admitting: Gastroenterology

## 2021-09-30 VITALS — BP 131/85 | HR 53 | Temp 98.4°F | Resp 11 | Ht 69.0 in | Wt 192.0 lb

## 2021-09-30 DIAGNOSIS — D120 Benign neoplasm of cecum: Secondary | ICD-10-CM

## 2021-09-30 DIAGNOSIS — D125 Benign neoplasm of sigmoid colon: Secondary | ICD-10-CM | POA: Diagnosis not present

## 2021-09-30 DIAGNOSIS — D123 Benign neoplasm of transverse colon: Secondary | ICD-10-CM | POA: Diagnosis not present

## 2021-09-30 DIAGNOSIS — Z86010 Personal history of colonic polyps: Secondary | ICD-10-CM

## 2021-09-30 MED ORDER — SODIUM CHLORIDE 0.9 % IV SOLN: 500.0000 mL | Freq: Once | INTRAVENOUS | Status: AC

## 2021-09-30 NOTE — Op Note (Signed)
[facility] Patient Name: [name] Procedure Date: 09/30/2021 8:01 AM MRN: [id number] Endoscopist: Gays. Loletha Carrow , MD Age: 75 Referring MD:  Date of Birth: 05-23-1946 Gender: Male Account #: [id number] Procedure:                Colonoscopy Indications:              Surveillance: Personal history of adenomatous                            polyps on last colonoscopy 5 years ago                           <28mm TA 08/2016 Medicines:                Monitored Anesthesia Care Procedure:                Pre-Anesthesia Assessment:                           - Prior to the procedure, a History and Physical                            was performed, and patient medications and                            allergies were reviewed. The patient's tolerance of                            previous anesthesia was also reviewed. The risks                            and benefits of the procedure and the sedation                            options and risks were discussed with the patient.                            All questions were answered, and informed consent                            was obtained. Prior Anticoagulants: The patient has                            taken no previous anticoagulant or antiplatelet                            agents. ASA Grade Assessment: II - A patient with                            mild systemic disease. After reviewing the risks                            and benefits, the patient was deemed in  satisfactory condition to undergo the procedure.                           After obtaining informed consent, the colonoscope                            was passed under direct vision. Throughout the                            procedure, the patient's blood pressure, pulse, and                            oxygen saturations were monitored continuously. The                            CF [id number] #[id number] was introduced through the anus                             and advanced to the the cecum, identified by                            appendiceal orifice and ileocecal valve. The                            colonoscopy was performed without difficulty. The                            patient tolerated the procedure well. The quality                            of the bowel preparation was good. The ileocecal                            valve, appendiceal orifice, and rectum were                            photographed. Scope In: 8:05:03 AM Scope Out: 8:22:32 AM Scope Withdrawal Time: 0 hours 12 minutes 35 seconds  Total Procedure Duration: 0 hours 17 minutes 29 seconds  Findings:                 The perianal and digital rectal examinations were                            normal.                           Three sessile polyps were found in the distal                            sigmoid colon, transverse colon and ileocecal                            valve. The polyps were diminutive in size. These  polyps were removed with a cold snare. Resection                            and retrieval were complete.                           The exam was otherwise without abnormality on                            direct and retroflexion views. Complications:            No immediate complications. Estimated Blood Loss:     Estimated blood loss was minimal. Impression:               - Three diminutive polyps in the distal sigmoid                            colon, in the transverse colon and at the ileocecal                            valve, removed with a cold snare. Resected and                            retrieved.                           - The examination was otherwise normal on direct                            and retroflexion views. Recommendation:           - Patient has a contact number available for                            emergencies. The signs and symptoms of potential                            delayed  complications were discussed with the                            patient. Return to normal activities tomorrow.                            Written discharge instructions were provided to the                            patient.                           - Resume previous diet.                           - Continue present medications.                           - Await pathology results.                           -  Repeat colonoscopy may be recommended for                            surveillance. That will be determined after                            pathology results from today's exam become                            available for review. Amariah Kierstead L. Loletha Carrow, MD 09/30/2021 8:29:15 AM This report has been signed electronically.

## 2021-09-30 NOTE — Patient Instructions (Signed)
Information on polyps given to you today.  Await pathology results.  Resume previous diet and medications.  YOU HAD AN ENDOSCOPIC PROCEDURE TODAY AT THE  ENDOSCOPY CENTER:   Refer to the procedure report that was given to you for any specific questions about what was found during the examination.  If the procedure report does not answer your questions, please call your gastroenterologist to clarify.  If you requested that your care partner not be given the details of your procedure findings, then the procedure report has been included in a sealed envelope for you to review at your convenience later.  YOU SHOULD EXPECT: Some feelings of bloating in the abdomen. Passage of more gas than usual.  Walking can help get rid of the air that was put into your GI tract during the procedure and reduce the bloating. If you had a lower endoscopy (such as a colonoscopy or flexible sigmoidoscopy) you may notice spotting of blood in your stool or on the toilet paper. If you underwent a bowel prep for your procedure, you may not have a normal bowel movement for a few days.  Please Note:  You might notice some irritation and congestion in your nose or some drainage.  This is from the oxygen used during your procedure.  There is no need for concern and it should clear up in a day or so.  SYMPTOMS TO REPORT IMMEDIATELY:   Following lower endoscopy (colonoscopy or flexible sigmoidoscopy):  Excessive amounts of blood in the stool  Significant tenderness or worsening of abdominal pains  Swelling of the abdomen that is new, acute  Fever of 100F or higher   For urgent or emergent issues, a gastroenterologist can be reached at any hour by calling [phone]. Do not use MyChart messaging for urgent concerns.    DIET:  We do recommend a small meal at first, but then you may proceed to your regular diet.  Drink plenty of fluids but you should avoid alcoholic beverages for 24 hours.  ACTIVITY:  You should  plan to take it easy for the rest of today and you should NOT DRIVE or use heavy machinery until tomorrow (because of the sedation medicines used during the test).    FOLLOW UP: Our staff will call the number listed on your records 48-72 hours following your procedure to check on you and address any questions or concerns that you may have regarding the information given to you following your procedure. If we do not reach you, we will leave a message.  We will attempt to reach you two times.  During this call, we will ask if you have developed any symptoms of COVID 19. If you develop any symptoms (ie: fever, flu-like symptoms, shortness of breath, cough etc.) before then, please call [phone].  If you test positive for Covid 19 in the 2 weeks post procedure, please call and report this information to us.    If any biopsies were taken you will be contacted by phone or by letter within the next 1-3 weeks.  Please call us at [phone] if you have not heard about the biopsies in 3 weeks.    SIGNATURES/CONFIDENTIALITY: You and/or your care partner have signed paperwork which will be entered into your electronic medical record.  These signatures attest to the fact that that the information above on your After Visit Summary has been reviewed and is understood.  Full responsibility of the confidentiality of this discharge information lies with you and/or your care-partner. 

## 2021-09-30 NOTE — Progress Notes (Signed)
VS by JD  Pt's states no medical or surgical changes since previsit or office visit.  

## 2021-09-30 NOTE — Progress Notes (Signed)
Report given to PACU, vss 

## 2021-09-30 NOTE — Progress Notes (Signed)
History and Physical:  This patient presents for endoscopic testing for: Encounter Diagnosis  Name Primary?   Personal history of colonic polyps Yes    Patient denies chronic abdominal pain, rectal bleeding, constipation or diarrhea. <75mm TA 08/2016  ROS: Patient denies chest pain or cough   Past Medical History: Past Medical History:  Diagnosis Date   Cardiac murmur    Hyperlipidemia    Prostate cancer ([place]) 2008   PTSD (post-traumatic stress disorder)      Past Surgical History: Past Surgical History:  Procedure Laterality Date   COLONOSCOPY  2017   Dr.Danis   HAND SURGERY     4-5 times   INGUINAL HERNIA REPAIR     KNEE ARTHROSCOPY     left   NASAL SEPTUM SURGERY     PROSTATECTOMY     TONSILLECTOMY      Allergies: Allergies  Allergen Reactions   Penicillins     REACTION: Diarrhea,anxiety attack    Outpatient Meds: Current Outpatient Medications  Medication Sig Dispense Refill   atorvastatin (LIPITOR) 20 MG tablet Take 20 mg by mouth daily.     Cholecalciferol (VITAMIN D) 2000 units CAPS Take by mouth.     levothyroxine (SYNTHROID, LEVOTHROID) 50 MCG tablet Take 50 mcg by mouth daily before breakfast.     QUEtiapine (SEROQUEL) 200 MG tablet TAKE ONE TABLET BY MOUTH AT BEDTIME FOR DEPRESSION AND INSOMNIA, MOOD     sertraline (ZOLOFT) 100 MG tablet TAKE ONE AND ONE-HALF TABLETS BY MOUTH IN THE MORNING FOR MENTAL HEALTH     Calcium Carbonate Antacid (TUMS PO) Take by mouth.     diclofenac Sodium (VOLTAREN) 1 % GEL Apply topically daily as needed.     Current Facility-Administered Medications  Medication Dose Route Frequency Provider Last Rate Last Admin   0.9 %  sodium chloride infusion  500 mL Intravenous Once Doran Stabler, MD          ___________________________________________________________________ Objective   Exam:  BP (!) 143/98    Pulse 67    Temp 98.4 F (36.9 C)    Resp 13    Ht 5\' 9"  (1.753 m)    Wt 192 lb (87.1 kg)    SpO2 98%     BMI 28.35 kg/m   CV: RRR without murmur, S1/S2 Resp: clear to auscultation bilaterally, normal RR and effort noted GI: soft, no tenderness, with active bowel sounds.   Assessment: Encounter Diagnosis  Name Primary?   Personal history of colonic polyps Yes     Plan: Colonoscopy  The benefits and risks of the planned procedure were described in detail with the patient or (when appropriate) their health care proxy.  Risks were outlined as including, but not limited to, bleeding, infection, perforation, adverse medication reaction leading to cardiac or pulmonary decompensation, pancreatitis (if ERCP).  The limitation of incomplete mucosal visualization was also discussed.  No guarantees or warranties were given.   The patient is appropriate for an endoscopic procedure in the ambulatory setting.   - Wilfrid Lund, MD

## 2021-09-30 NOTE — Progress Notes (Signed)
Called to room to assist during endoscopic procedure.  Patient ID and intended procedure confirmed with present staff. Received instructions for my participation in the procedure from the performing physician.  

## 2021-10-02 ENCOUNTER — Telehealth: Payer: Self-pay | Admitting: *Deleted

## 2021-10-02 NOTE — Telephone Encounter (Signed)
°  Follow up Call-  Call back number 09/30/2021  Post procedure Call Back phone  # [phone]  Permission to leave phone message Yes  Some recent data might be hidden     Patient questions:  Do you have a fever, pain , or abdominal swelling? NO. Pain Score  0 *  Have you tolerated food without any problems? Yes.    Have you been able to return to your normal activities? Yes.    Do you have any questions about your discharge instructions: Diet   No. Medications  No. Follow up visit  No.  Do you have questions or concerns about your Care? No.  Actions: * If pain score is 4 or above: No action needed, pain <4.

## 2021-10-08 ENCOUNTER — Encounter: Payer: Self-pay | Admitting: Gastroenterology

## 2024-12-21 ENCOUNTER — Ambulatory Visit: Payer: Self-pay | Admitting: Internal Medicine
